# Patient Record
Sex: Female | Born: 1985 | Race: Black or African American | Hispanic: No | Marital: Single | Smoking: Former smoker
Health system: Southern US, Community
[De-identification: ages and names within clinical notes are randomized; demographics above are authoritative.]

## PROBLEM LIST (undated history)

## (undated) DIAGNOSIS — Z87440 Personal history of urinary (tract) infections: Secondary | ICD-10-CM

## (undated) DIAGNOSIS — Z8619 Personal history of other infectious and parasitic diseases: Secondary | ICD-10-CM

## (undated) DIAGNOSIS — M5416 Radiculopathy, lumbar region: Secondary | ICD-10-CM

## (undated) DIAGNOSIS — N2889 Other specified disorders of kidney and ureter: Secondary | ICD-10-CM

## (undated) DIAGNOSIS — A64 Unspecified sexually transmitted disease: Secondary | ICD-10-CM

## (undated) HISTORY — DX: Other specified disorders of kidney and ureter: N28.89

## (undated) HISTORY — DX: Personal history of urinary (tract) infections: Z87.440

## (undated) HISTORY — DX: Radiculopathy, lumbar region: M54.16

## (undated) HISTORY — DX: Unspecified sexually transmitted disease: A64

## (undated) HISTORY — DX: Personal history of other infectious and parasitic diseases: Z86.19

---

## 2009-10-08 ENCOUNTER — Emergency Department (HOSPITAL_COMMUNITY): Admission: EM | Admit: 2009-10-08 | Discharge: 2009-10-08 | Payer: Self-pay | Admitting: Emergency Medicine

## 2013-03-11 LAB — HM PAP SMEAR: HM Pap smear: NORMAL

## 2013-03-14 ENCOUNTER — Other Ambulatory Visit (HOSPITAL_COMMUNITY)
Admission: RE | Admit: 2013-03-14 | Discharge: 2013-03-14 | Disposition: A | Discharge status: Home / Self Care | Payer: 59 | Source: Ambulatory Visit | Attending: Obstetrics and Gynecology | Admitting: Obstetrics and Gynecology

## 2013-03-14 ENCOUNTER — Other Ambulatory Visit: Payer: Self-pay | Admitting: Obstetrics and Gynecology

## 2013-03-14 DIAGNOSIS — Z01419 Encounter for gynecological examination (general) (routine) without abnormal findings: Secondary | ICD-10-CM | POA: Insufficient documentation

## 2013-03-14 DIAGNOSIS — Z113 Encounter for screening for infections with a predominantly sexual mode of transmission: Secondary | ICD-10-CM | POA: Insufficient documentation

## 2014-01-17 ENCOUNTER — Other Ambulatory Visit (INDEPENDENT_AMBULATORY_CARE_PROVIDER_SITE_OTHER): Payer: 59

## 2014-01-17 ENCOUNTER — Encounter: Payer: Self-pay | Admitting: Internal Medicine

## 2014-01-17 ENCOUNTER — Ambulatory Visit (INDEPENDENT_AMBULATORY_CARE_PROVIDER_SITE_OTHER): Payer: 59 | Admitting: Internal Medicine

## 2014-01-17 VITALS — BP 112/76 | HR 92 | Temp 98.4°F | Ht 67.0 in | Wt 245.4 lb

## 2014-01-17 DIAGNOSIS — Z0000 Encounter for general adult medical examination without abnormal findings: Secondary | ICD-10-CM

## 2014-01-17 DIAGNOSIS — Z1159 Encounter for screening for other viral diseases: Secondary | ICD-10-CM

## 2014-01-17 DIAGNOSIS — Z23 Encounter for immunization: Secondary | ICD-10-CM

## 2014-01-17 DIAGNOSIS — E669 Obesity, unspecified: Secondary | ICD-10-CM | POA: Insufficient documentation

## 2014-01-17 LAB — CBC WITH DIFFERENTIAL/PLATELET
Basophils Absolute: 0 10*3/uL (ref 0.0–0.1)
Basophils Relative: 0.4 % (ref 0.0–3.0)
EOS ABS: 0.1 10*3/uL (ref 0.0–0.7)
Eosinophils Relative: 0.9 % (ref 0.0–5.0)
HEMATOCRIT: 41.8 % (ref 36.0–46.0)
Hemoglobin: 14.1 g/dL (ref 12.0–15.0)
LYMPHS ABS: 2.2 10*3/uL (ref 0.7–4.0)
Lymphocytes Relative: 34.8 % (ref 12.0–46.0)
MCHC: 33.8 g/dL (ref 30.0–36.0)
MCV: 89.4 fl (ref 78.0–100.0)
MONO ABS: 0.4 10*3/uL (ref 0.1–1.0)
Monocytes Relative: 5.7 % (ref 3.0–12.0)
Neutro Abs: 3.7 10*3/uL (ref 1.4–7.7)
Neutrophils Relative %: 58.2 % (ref 43.0–77.0)
PLATELETS: 236 10*3/uL (ref 150.0–400.0)
RBC: 4.68 Mil/uL (ref 3.87–5.11)
RDW: 13.6 % (ref 11.5–15.5)
WBC: 6.4 10*3/uL (ref 4.0–10.5)

## 2014-01-17 LAB — URINALYSIS, ROUTINE W REFLEX MICROSCOPIC
Bilirubin Urine: NEGATIVE
Hgb urine dipstick: NEGATIVE
Ketones, ur: NEGATIVE
Leukocytes, UA: NEGATIVE
Nitrite: POSITIVE — AB
PH: 6 (ref 5.0–8.0)
RBC / HPF: NONE SEEN (ref 0–?)
TOTAL PROTEIN, URINE-UPE24: NEGATIVE
URINE GLUCOSE: NEGATIVE
UROBILINOGEN UA: 0.2 (ref 0.0–1.0)

## 2014-01-17 LAB — LIPID PANEL
CHOLESTEROL: 175 mg/dL (ref 0–200)
HDL: 32.5 mg/dL — AB (ref >39.00)
LDL Cholesterol: 118 mg/dL — ABNORMAL HIGH (ref 0–99)
TRIGLYCERIDES: 125 mg/dL (ref 0.0–149.0)
Total CHOL/HDL Ratio: 5
VLDL: 25 mg/dL (ref 0.0–40.0)

## 2014-01-17 LAB — BASIC METABOLIC PANEL
BUN: 11 mg/dL (ref 6–23)
CO2: 27 meq/L (ref 19–32)
Calcium: 9.3 mg/dL (ref 8.4–10.5)
Chloride: 102 mEq/L (ref 96–112)
Creatinine, Ser: 0.7 mg/dL (ref 0.4–1.2)
GFR: 121.54 mL/min (ref >60.00)
Glucose, Bld: 75 mg/dL (ref 70–99)
POTASSIUM: 3.5 meq/L (ref 3.5–5.1)
Sodium: 136 mEq/L (ref 135–145)

## 2014-01-17 LAB — HEPATIC FUNCTION PANEL
ALT: 46 U/L — ABNORMAL HIGH (ref 0–35)
AST: 26 U/L (ref 0–37)
Albumin: 3.9 g/dL (ref 3.5–5.2)
Alkaline Phosphatase: 107 U/L (ref 39–117)
Bilirubin, Direct: 0.1 mg/dL (ref 0.0–0.3)
TOTAL PROTEIN: 7.8 g/dL (ref 6.0–8.3)
Total Bilirubin: 0.8 mg/dL (ref 0.2–1.2)

## 2014-01-17 LAB — TSH: TSH: 1.39 u[IU]/mL (ref 0.35–4.50)

## 2014-01-17 NOTE — Patient Instructions (Addendum)
It was good to see you today.  We have reviewed your prior records including labs and tests today  Health Maintenance reviewed - Gardisil #2 given today - return in 78mofor #3 (last in series) -all other recommended immunizations and age-appropriate screenings are up-to-date.  Test(s) ordered today. Your results will be released to [facility](or called to you) after review, usually within 72hours after test completion. If any changes need to be made, you will be notified at that same time.  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 Look into [email or web address] other type of food journal to assist you in this process.  Please schedule followup in 12 months for annual exam and labs, call sooner if problems.  Health Maintenance, Female A healthy lifestyle and preventative care can promote health and wellness.  Maintain regular health, dental, and eye exams.  Eat a healthy diet. Foods like vegetables, fruits, whole grains, low-fat dairy products, and lean protein foods contain the nutrients you need without too many calories. Decrease your intake of foods high in solid fats, added sugars, and salt. Get information about a proper diet from your caregiver, if necessary.  Regular physical exercise is one of the most important things you can do for your health. Most adults should get at least 150 minutes of moderate-intensity exercise (any activity that increases your heart rate and causes you to sweat) each week. In addition, most adults need muscle-strengthening exercises on 2 or more days a week.   Maintain a healthy weight. The body mass index (BMI) is a screening tool to identify possible weight problems. It provides an estimate of body fat  based on height and weight. Your caregiver can help determine your BMI, and can help you achieve or maintain a healthy weight. For adults 20 years and older:  A BMI below 18.5 is considered underweight.  A BMI of 18.5 to 24.9 is normal.  A BMI of 25 to 29.9 is considered overweight.  A BMI of 30 and above is considered obese.  Maintain normal blood lipids and cholesterol by exercising and minimizing your intake of saturated fat. Eat a balanced diet with plenty of fruits and vegetables. Blood tests for lipids and cholesterol should begin at age 2161and be repeated every 5 years. If your lipid or cholesterol levels are high, you are over 50, or you are a high risk for heart disease, you may need your cholesterol levels checked more frequently.Ongoing high lipid and cholesterol levels should be treated with medicines if diet and exercise are not effective.  If you smoke, find out from your caregiver how to quit. If you do not use tobacco, do not start.  Lung cancer screening is recommended for adults aged 524 80years who are at high risk for developing lung cancer because of a history of smoking. Yearly low-dose computed tomography (CT) is recommended for people who have at least a 30-pack-year history of smoking and are a current smoker or have quit within the past 15 years. A pack year of smoking is smoking an average of 1 pack of cigarettes a day for 1 year (for example: 1 pack a day for 30 years or 2 packs a day for 15 years). Yearly screening should continue until the smoker has stopped smoking  for at least 15 years. Yearly screening should also be stopped for people who develop a health problem that would prevent them from having lung cancer treatment.  If you are pregnant, do not drink alcohol. If you are breastfeeding, be very cautious about drinking alcohol. If you are not pregnant and choose to drink alcohol, do not exceed 1 drink per day. One drink is considered to be 12 ounces (355 mL) of  beer, 5 ounces (148 mL) of wine, or 1.5 ounces (44 mL) of liquor.  Avoid use of street drugs. Do not share needles with anyone. Ask for help if you need support or instructions about stopping the use of drugs.  High blood pressure causes heart disease and increases the risk of stroke. Blood pressure should be checked at least every 1 to 2 years. Ongoing high blood pressure should be treated with medicines, if weight loss and exercise are not effective.  If you are 62 to 27 years old, ask your caregiver if you should take aspirin to prevent strokes.  Diabetes screening involves taking a blood sample to check your fasting blood sugar level. This should be done once every 3 years, after age 71, if you are within normal weight and without risk factors for diabetes. Testing should be considered at a younger age or be carried out more frequently if you are overweight and have at least 1 risk factor for diabetes.  Breast cancer screening is essential preventative care for women. You should practice "breast self-awareness." This means understanding the normal appearance and feel of your breasts and may include breast self-examination. Any changes detected, no matter how small, should be reported to a caregiver. Women in their 32s and 30s should have a clinical breast exam (CBE) by a caregiver as part of a regular health exam every 1 to 3 years. After age 80, women should have a CBE every year. Starting at age 25, women should consider having a mammogram (breast X-ray) every year. Women who have a family history of breast cancer should talk to their caregiver about genetic screening. Women at a high risk of breast cancer should talk to their caregiver about having an MRI and a mammogram every year.  Breast cancer gene (BRCA)-related cancer risk assessment is recommended for women who have family members with BRCA-related cancers. BRCA-related cancers include breast, ovarian, tubal, and peritoneal cancers. Having  family members with these cancers may be associated with an increased risk for harmful changes (mutations) in the breast cancer genes BRCA1 and BRCA2. Results of the assessment will determine the need for genetic counseling and BRCA1 and BRCA2 testing.  The Pap test is a screening test for cervical cancer. Women should have a Pap test starting at age 66. Between ages 46 and 44, Pap tests should be repeated every 2 years. Beginning at age 37, you should have a Pap test every 3 years as long as the past 3 Pap tests have been normal. If you had a hysterectomy for a problem that was not cancer or a condition that could lead to cancer, then you no longer need Pap tests. If you are between ages 22 and 30, and you have had normal Pap tests going back 10 years, you no longer need Pap tests. If you have had past treatment for cervical cancer or a condition that could lead to cancer, you need Pap tests and screening for cancer for at least 20 years after your treatment. If Pap tests have been discontinued, risk factors (such  as a new sexual partner) need to be reassessed to determine if screening should be resumed. Some women have medical problems that increase the chance of getting cervical cancer. In these cases, your caregiver may recommend more frequent screening and Pap tests.  The human papillomavirus (HPV) test is an additional test that may be used for cervical cancer screening. The HPV test looks for the virus that can cause the cell changes on the cervix. The cells collected during the Pap test can be tested for HPV. The HPV test could be used to screen women aged 51 years and older, and should be used in women of any age who have unclear Pap test results. After the age of 15, women should have HPV testing at the same frequency as a Pap test.  Colorectal cancer can be detected and often prevented. Most routine colorectal cancer screening begins at the age of 57 and continues through age 58. However, your  caregiver may recommend screening at an earlier age if you have risk factors for colon cancer. On a yearly basis, your caregiver may provide home test kits to check for hidden blood in the stool. Use of a small camera at the end of a tube, to directly examine the colon (sigmoidoscopy or colonoscopy), can detect the earliest forms of colorectal cancer. Talk to your caregiver about this at age 42, when routine screening begins. Direct examination of the colon should be repeated every 5 to 10 years through age 23, unless early forms of pre-cancerous polyps or small growths are found.  Hepatitis C blood testing is recommended for all people born from [birth year] through 1964 and any individual with known risks for hepatitis C.  Practice safe sex. Use condoms and avoid high-risk sexual practices to reduce the spread of sexually transmitted infections (STIs). Sexually active women aged 81 and younger should be checked for Chlamydia, which is a common sexually transmitted infection. Older women with new or multiple partners should also be tested for Chlamydia. Testing for other STIs is recommended if you are sexually active and at increased risk.  Osteoporosis is a disease in which the bones lose minerals and strength with aging. This can result in serious bone fractures. The risk of osteoporosis can be identified using a bone density scan. Women ages 16 and over and women at risk for fractures or osteoporosis should discuss screening with their caregivers. Ask your caregiver whether you should be taking a calcium supplement or vitamin D to reduce the rate of osteoporosis.  Menopause can be associated with physical symptoms and risks. Hormone replacement therapy is available to decrease symptoms and risks. You should talk to your caregiver about whether hormone replacement therapy is right for you.  Use sunscreen. Apply sunscreen liberally and repeatedly throughout the day. You should seek shade when your shadow is  shorter than you. Protect yourself by wearing long sleeves, pants, a wide-brimmed hat, and sunglasses year round, whenever you are outdoors.  Notify your caregiver of new moles or changes in moles, especially if there is a change in shape or color. Also notify your caregiver if a mole is larger than the size of a pencil eraser.  Stay current with your immunizations. Document Released: 02/28/2011 Document Revised: 12/10/2012 Document Reviewed: 02/28/2011 [facility] Patient Information 2014 [place].

## 2014-01-17 NOTE — Progress Notes (Signed)
   Subjective:    Patient ID: Theresa Trevino, female    DOB: 1986-02-27, 27 y.o.   MRN: [id number]  HPI  New patient to me, here to establish with PCP Also patient is here today for annual physical. Patient feels well and has no complaints. Needs paperwork for nursing school completed Reviewed chronic medical issues and interval medical events  Past Medical History  Diagnosis Date  . Hx: UTI (urinary tract infection)   . History of chicken pox    No family history on file. History  Substance Use Topics  . Smoking status: Never Smoker   . Smokeless tobacco: Not on file  . Alcohol Use: Yes    Review of Systems  Constitutional: Negative for fatigue and unexpected weight change.  Respiratory: Negative for cough, shortness of breath and wheezing.   Cardiovascular: Negative for chest pain, palpitations and leg swelling.  Gastrointestinal: Negative for nausea, abdominal pain and diarrhea.  Neurological: Negative for dizziness, weakness, light-headedness and headaches.  Psychiatric/Behavioral: Negative for dysphoric mood. The patient is not nervous/anxious.   All other systems reviewed and are negative.      Objective:   Physical Exam  BP 112/76  Pulse 92  Temp(Src) 98.4 F (36.9 C) (Oral)  Ht $R'5\' 7"'jv$  (1.702 m)  Wt 245 lb 6.4 oz (111.313 kg)  BMI 38.43 kg/m2  SpO2 97% Wt Readings from Last 3 Encounters:  01/17/14 245 lb 6.4 oz (111.313 kg)   Constitutional: She is obese, but appears well-developed and well-nourished. No distress.  HENT: Head: Normocephalic and atraumatic. Ears: B TMs ok, no erythema or effusion; Nose: Nose normal. Mouth/Throat: Oropharynx is clear and moist. No oropharyngeal exudate.  Eyes: Conjunctivae and EOM are normal. Pupils are equal, round, and reactive to light. No scleral icterus.  Neck: Normal range of motion. Neck supple. No JVD present. No thyromegaly present.  Cardiovascular: Normal rate, regular rhythm and normal heart sounds.  No murmur heard.  No BLE edema. Pulmonary/Chest: Effort normal and breath sounds normal. No respiratory distress. She has no wheezes.  Abdominal: Soft. Bowel sounds are normal. She exhibits no distension. There is no tenderness. no masses Musculoskeletal: Normal range of motion, no joint effusions. No gross deformities Neurological: She is alert and oriented to person, place, and time. No cranial nerve deficit. Coordination, balance, strength, speech and gait are normal.  Skin: Skin is warm and dry. No rash noted. No erythema.  Psychiatric: She has a normal mood and affect. Her behavior is normal. Judgment and thought content normal.    No results found for this basename: WBC, HGB, HCT, PLT, GLUCOSE, CHOL, TRIG, HDL, LDLDIRECT, LDLCALC, ALT, AST, NA, K, CL, CREATININE, BUN, CO2, TSH, PSA, INR, GLUF, HGBA1C, MICROALBUR    No results found.     Assessment & Plan:   CPX/v70.0 - Patient has been counseled on age-appropriate routine health concerns for screening and prevention. These are reviewed and up-to-date. Immunizations are up-to-date or declined. Labs ordered and reviewed. Form for [employer] completed today pending MMR titers (ordered today)

## 2014-01-18 LAB — RUBEOLA ANTIBODY IGG: Rubeola IgG: 82.6 AU/mL — ABNORMAL HIGH (ref <25.00)

## 2014-01-18 LAB — MUMPS ANTIBODY, IGG: Mumps IgG: 59.2 AU/mL — ABNORMAL HIGH (ref <9.00)

## 2014-01-18 NOTE — Assessment & Plan Note (Signed)
Wt Readings from Last 3 Encounters:  01/17/14 245 lb 6.4 oz (111.313 kg)   The patient is asked to make an attempt to improve diet and exercise patterns to aid in medical management of this problem.

## 2014-01-21 ENCOUNTER — Telehealth: Payer: Self-pay | Admitting: *Deleted

## 2014-01-21 ENCOUNTER — Encounter: Payer: Self-pay | Admitting: Internal Medicine

## 2014-01-21 MED ORDER — CIPROFLOXACIN HCL 500 MG PO TABS: 500.0000 mg | ORAL_TABLET | Freq: Two times a day (BID) | ORAL | Status: DC

## 2014-01-21 NOTE — Telephone Encounter (Signed)
MD didn't send cipro(see lab report) sending to [facility].../lmb

## 2014-03-13 ENCOUNTER — Encounter: Payer: Self-pay | Admitting: Internal Medicine

## 2014-03-13 ENCOUNTER — Ambulatory Visit (INDEPENDENT_AMBULATORY_CARE_PROVIDER_SITE_OTHER): Payer: 59 | Admitting: Internal Medicine

## 2014-03-13 ENCOUNTER — Other Ambulatory Visit (INDEPENDENT_AMBULATORY_CARE_PROVIDER_SITE_OTHER): Payer: 59

## 2014-03-13 VITALS — BP 118/72 | HR 95 | Temp 98.5°F | Ht 67.0 in | Wt 249.0 lb

## 2014-03-13 DIAGNOSIS — R300 Dysuria: Secondary | ICD-10-CM

## 2014-03-13 DIAGNOSIS — Z202 Contact with and (suspected) exposure to infections with a predominantly sexual mode of transmission: Secondary | ICD-10-CM

## 2014-03-13 DIAGNOSIS — Z1159 Encounter for screening for other viral diseases: Secondary | ICD-10-CM

## 2014-03-13 LAB — URINALYSIS, ROUTINE W REFLEX MICROSCOPIC
BILIRUBIN URINE: NEGATIVE
Hgb urine dipstick: NEGATIVE
KETONES UR: NEGATIVE
Leukocytes, UA: NEGATIVE
Nitrite: NEGATIVE
PH: 7 (ref 5.0–8.0)
RBC / HPF: NONE SEEN (ref 0–?)
TOTAL PROTEIN, URINE-UPE24: NEGATIVE
URINE GLUCOSE: NEGATIVE
Urobilinogen, UA: 0.2 (ref 0.0–1.0)
WBC, UA: NONE SEEN (ref 0–?)

## 2014-03-13 NOTE — Progress Notes (Signed)
Pre visit review using our clinic review tool, if applicable. No additional management support is needed unless otherwise documented below in the visit note. 

## 2014-03-13 NOTE — Progress Notes (Signed)
   Subjective:    Patient ID: Theresa Trevino, female    DOB: 1986-04-20, 28 y.o.   MRN: [id number]  HPI  Patient is here for follow up - requests labs for STI screen Follows with gyn for PAP Also reviewed chronic medical issues and interval medical events  Past Medical History  Diagnosis Date  . Hx: UTI (urinary tract infection)   . History of chicken pox     Review of Systems  Constitutional: Negative for fatigue and unexpected weight change.  Genitourinary: Positive for dysuria and frequency. Negative for vaginal discharge and genital sores.       Objective:   Physical Exam  BP 118/72  Pulse 95  Temp(Src) 98.5 F (36.9 C) (Oral)  Ht 5\' 7"  (1.702 m)  Wt 249 lb (112.946 kg)  BMI 38.99 kg/m2  SpO2 99% Wt Readings from Last 3 Encounters:  03/13/14 249 lb (112.946 kg)  01/17/14 245 lb 6.4 oz (111.313 kg)   Constitutional: She is MO, appears well-developed and well-nourished. No distress.  Neck: Normal range of motion. Neck supple. No JVD present. No thyromegaly present.  Cardiovascular: Normal rate, regular rhythm and normal heart sounds.  No murmur heard. No BLE edema. Pulmonary/Chest: Effort normal and breath sounds normal. No respiratory distress. She has no wheezes. GU: defer to gyn  Psychiatric: She has a normal mood and affect. Her behavior is normal. Judgment and thought content normal.   Lab Results  Component Value Date   WBC 6.4 01/17/2014   HGB 14.1 01/17/2014   HCT 41.8 01/17/2014   PLT 236.0 01/17/2014   GLUCOSE 75 01/17/2014   CHOL 175 01/17/2014   TRIG 125.0 01/17/2014   HDL 32.50* 01/17/2014   LDLCALC 118* 01/17/2014   ALT 46* 01/17/2014   AST 26 01/17/2014   NA 136 01/17/2014   K 3.5 01/17/2014   CL 102 01/17/2014   CREATININE 0.7 01/17/2014   BUN 11 01/17/2014   CO2 27 01/17/2014   TSH 1.39 01/17/2014    No results found.     Assessment & Plan:   STI exposure - check labs - education on safe sex provided/reviewed  Dysuria - check UA given recurrent  UTI

## 2014-03-13 NOTE — Patient Instructions (Signed)
It was good to see you today.  We have reviewed your prior records including labs and tests today  Test(s) ordered today. Your results will be released to MyChart (or called to you) after review, usually within 72hours after test completion. If any changes need to be made, you will be notified at that same time.  Medications reviewed and updated, no changes recommended at this time.  Please schedule followup in 12 months for annual, call sooner if problems.

## 2014-03-14 LAB — RPR

## 2014-03-14 LAB — HIV ANTIBODY (ROUTINE TESTING W REFLEX): HIV: NONREACTIVE

## 2014-03-14 LAB — RUBELLA SCREEN: Rubella: 3.18 Index — ABNORMAL HIGH (ref <0.90)

## 2014-03-15 LAB — GC/CHLAMYDIA PROBE AMP, URINE
Chlamydia, Swab/Urine, PCR: NEGATIVE
GC Probe Amp, Urine: NEGATIVE

## 2014-07-18 ENCOUNTER — Ambulatory Visit (INDEPENDENT_AMBULATORY_CARE_PROVIDER_SITE_OTHER): Payer: 59 | Admitting: *Deleted

## 2014-07-18 DIAGNOSIS — Z23 Encounter for immunization: Secondary | ICD-10-CM

## 2014-11-26 ENCOUNTER — Encounter: Payer: Self-pay | Admitting: Internal Medicine

## 2014-11-26 ENCOUNTER — Other Ambulatory Visit (INDEPENDENT_AMBULATORY_CARE_PROVIDER_SITE_OTHER): Payer: 59

## 2014-11-26 ENCOUNTER — Other Ambulatory Visit: Payer: Self-pay | Admitting: Internal Medicine

## 2014-11-26 ENCOUNTER — Ambulatory Visit (INDEPENDENT_AMBULATORY_CARE_PROVIDER_SITE_OTHER): Payer: 59 | Admitting: Internal Medicine

## 2014-11-26 VITALS — BP 112/70 | HR 84 | Temp 97.5°F | Ht 67.0 in | Wt 249.0 lb

## 2014-11-26 DIAGNOSIS — R1030 Lower abdominal pain, unspecified: Secondary | ICD-10-CM

## 2014-11-26 DIAGNOSIS — R1013 Epigastric pain: Secondary | ICD-10-CM

## 2014-11-26 LAB — URINALYSIS, ROUTINE W REFLEX MICROSCOPIC
Bilirubin Urine: NEGATIVE
Hgb urine dipstick: NEGATIVE
Ketones, ur: NEGATIVE
Leukocytes, UA: NEGATIVE
Nitrite: POSITIVE — AB
PH: 6 (ref 5.0–8.0)
Specific Gravity, Urine: 1.025 (ref 1.000–1.030)
Total Protein, Urine: NEGATIVE
UROBILINOGEN UA: 0.2 (ref 0.0–1.0)
Urine Glucose: NEGATIVE

## 2014-11-26 LAB — CBC WITH DIFFERENTIAL/PLATELET
BASOS ABS: 0 10*3/uL (ref 0.0–0.1)
Basophils Relative: 0.5 % (ref 0.0–3.0)
EOS ABS: 0.1 10*3/uL (ref 0.0–0.7)
EOS PCT: 1 % (ref 0.0–5.0)
HEMATOCRIT: 41.3 % (ref 36.0–46.0)
Hemoglobin: 14.2 g/dL (ref 12.0–15.0)
Lymphocytes Relative: 37.2 % (ref 12.0–46.0)
Lymphs Abs: 2.5 10*3/uL (ref 0.7–4.0)
MCHC: 34.4 g/dL (ref 30.0–36.0)
MCV: 88.5 fl (ref 78.0–100.0)
Monocytes Absolute: 0.4 10*3/uL (ref 0.1–1.0)
Monocytes Relative: 6.5 % (ref 3.0–12.0)
NEUTROS ABS: 3.7 10*3/uL (ref 1.4–7.7)
Neutrophils Relative %: 54.8 % (ref 43.0–77.0)
Platelets: 233 10*3/uL (ref 150.0–400.0)
RBC: 4.67 Mil/uL (ref 3.87–5.11)
RDW: 13.9 % (ref 11.5–15.5)
WBC: 6.8 10*3/uL (ref 4.0–10.5)

## 2014-11-26 LAB — HEPATIC FUNCTION PANEL
ALK PHOS: 111 U/L (ref 39–117)
ALT: 39 U/L — AB (ref 0–35)
AST: 17 U/L (ref 0–37)
Albumin: 3.8 g/dL (ref 3.5–5.2)
Bilirubin, Direct: 0.1 mg/dL (ref 0.0–0.3)
Total Bilirubin: 0.3 mg/dL (ref 0.2–1.2)
Total Protein: 7.3 g/dL (ref 6.0–8.3)

## 2014-11-26 LAB — BASIC METABOLIC PANEL
BUN: 12 mg/dL (ref 6–23)
CO2: 26 mEq/L (ref 19–32)
Calcium: 9.2 mg/dL (ref 8.4–10.5)
Chloride: 105 mEq/L (ref 96–112)
Creatinine, Ser: 0.64 mg/dL (ref 0.40–1.20)
GFR: 142.79 mL/min (ref >60.00)
Glucose, Bld: 85 mg/dL (ref 70–99)
Potassium: 4 mEq/L (ref 3.5–5.1)
Sodium: 136 mEq/L (ref 135–145)

## 2014-11-26 MED ORDER — FAMOTIDINE 40 MG PO TABS: 40.0000 mg | ORAL_TABLET | Freq: Every day | ORAL | Status: DC

## 2014-11-26 MED ORDER — PROMETHAZINE HCL 12.5 MG PO TABS: 12.5000 mg | ORAL_TABLET | Freq: Three times a day (TID) | ORAL | Status: DC | PRN

## 2014-11-26 NOTE — Patient Instructions (Addendum)
It was good to see you today.  We have reviewed your prior records including labs and tests today  Test(s) ordered today. Your results will be released to MyChart (or called to you) after review, usually within 72hours after test completion. If any changes need to be made, you will be notified at that same time.  Medications reviewed and updated Take prescription Pepcid 40 mg once daily for next 30 days, then as needed for stomach discomfort Also use promethazine as needed/if needed for nausea Your prescription(s) have been submitted to your pharmacy. Please take as directed and contact our office if you believe you are having problem(s) with the medication(s).  Depending on lab results and your response to treatment, we will determine if other testing as needed. Please let us know if symptoms unimproved in the next 2 weeks, sooner if worse  Abdominal Pain, Women Abdominal (stomach, pelvic, or belly) pain can be caused by many things. It is important to tell your doctor:  The location of the pain.  Does it come and go or is it present all the time?  Are there things that start the pain (eating certain foods, exercise)?  Are there other symptoms associated with the pain (fever, nausea, vomiting, diarrhea)? All of this is helpful to know when trying to find the cause of the pain. CAUSES   Stomach: virus or bacteria infection, or ulcer.  Intestine: appendicitis (inflamed appendix), regional ileitis (Crohn's disease), ulcerative colitis (inflamed colon), irritable bowel syndrome, diverticulitis (inflamed diverticulum of the colon), or cancer of the stomach or intestine.  Gallbladder disease or stones in the gallbladder.  Kidney disease, kidney stones, or infection.  Pancreas infection or cancer.  Fibromyalgia (pain disorder).  Diseases of the female organs:  Uterus: fibroid (non-cancerous) tumors or infection.  Fallopian tubes: infection or tubal pregnancy.  Ovary: cysts or  tumors.  Pelvic adhesions (scar tissue).  Endometriosis (uterus lining tissue growing in the pelvis and on the pelvic organs).  Pelvic congestion syndrome (female organs filling up with blood just before the menstrual period).  Pain with the menstrual period.  Pain with ovulation (producing an egg).  Pain with an IUD (intrauterine device, birth control) in the uterus.  Cancer of the female organs.  Functional pain (pain not caused by a disease, may improve without treatment).  Psychological pain.  Depression. DIAGNOSIS  Your doctor will decide the seriousness of your pain by doing an examination.  Blood tests.  X-rays.  Ultrasound.  CT scan (computed tomography, special type of X-ray).  MRI (magnetic resonance imaging).  Cultures, for infection.  Barium enema (dye inserted in the large intestine, to better view it with X-rays).  Colonoscopy (looking in intestine with a lighted tube).  Laparoscopy (minor surgery, looking in abdomen with a lighted tube).  Major abdominal exploratory surgery (looking in abdomen with a large incision). TREATMENT  The treatment will depend on the cause of the pain.   Many cases can be observed and treated at home.  Over-the-counter medicines recommended by your caregiver.  Prescription medicine.  Antibiotics, for infection.  Birth control pills, for painful periods or for ovulation pain.  Hormone treatment, for endometriosis.  Nerve blocking injections.  Physical therapy.  Antidepressants.  Counseling with a psychologist or psychiatrist.  Minor or major surgery. HOME CARE INSTRUCTIONS   Do not take laxatives, unless directed by your caregiver.  Take over-the-counter pain medicine only if ordered by your caregiver. Do not take aspirin because it can cause an upset stomach or bleeding.  Try a clear liquid diet (broth or water) as ordered by your caregiver. Slowly move to a bland diet, as tolerated, if the pain is  related to the stomach or intestine.  Have a thermometer and take your temperature several times a day, and record it.  Bed rest and sleep, if it helps the pain.  Avoid sexual intercourse, if it causes pain.  Avoid stressful situations.  Keep your follow-up appointments and tests, as your caregiver orders.  If the pain does not go away with medicine or surgery, you may try:  Acupuncture.  Relaxation exercises (yoga, meditation).  Group therapy.  Counseling. SEEK MEDICAL CARE IF:   You notice certain foods cause stomach pain.  Your home care treatment is not helping your pain.  You need stronger pain medicine.  You want your IUD removed.  You feel faint or lightheaded.  You develop nausea and vomiting.  You develop a rash.  You are having side effects or an allergy to your medicine. SEEK IMMEDIATE MEDICAL CARE IF:   Your pain does not go away or gets worse.  You have a fever.  Your pain is felt only in portions of the abdomen. The right side could possibly be appendicitis. The left lower portion of the abdomen could be colitis or diverticulitis.  You are passing blood in your stools (bright red or black tarry stools, with or without vomiting).  You have blood in your urine.  You develop chills, with or without a fever.  You pass out. MAKE SURE YOU:   Understand these instructions.  Will watch your condition.  Will get help right away if you are not doing well or get worse. Document Released: 06/12/2007 Document Revised: 12/30/2013 Document Reviewed: 07/02/2009 [facility] Patient Information 2015 [place], [place]. This information is not intended to replace advice given to you by your health care provider. Make sure you discuss any questions you have with your health care provider.

## 2014-11-26 NOTE — Progress Notes (Signed)
Pre visit review using our clinic review tool, if applicable. No additional management support is needed unless otherwise documented below in the visit note. 

## 2014-11-26 NOTE — Progress Notes (Signed)
   Subjective:    Patient ID: Theresa Trevino, female    DOB: [birth year], 29 y.o.   MRN: [id number]  HPI  Patient here for abd pain and irregular bowels Onset of symptoms 2 weeks ago Pain located in epigastric region, radiating to left side Pain worse before meals and with laying down associated with symptoms of fullness and occasional nausea. Also constipation which has improved with probiotic use in the past 2 weeks No history of same Denies NSAID use, excess alcohol, history of ulcers or   Past Medical History  Diagnosis Date  . Hx: UTI (urinary tract infection)   . History of chicken pox     Review of Systems  Constitutional: Negative for fever and unexpected weight change.  Cardiovascular: Negative for chest pain and leg swelling.  Gastrointestinal: Positive for nausea (occ), abdominal pain (epigastric stab radiating L to back), constipation (improving with probiotic x 2 weeks) and blood in stool (streak and mucus x 1 BM 2 weeks ago). Negative for vomiting and diarrhea.  Allergic/Immunologic: Negative for environmental allergies and food allergies.       Objective:    Physical Exam  Constitutional: She appears well-developed and well-nourished. No distress.  obese  Cardiovascular: Normal rate, regular rhythm and normal heart sounds.   No murmur heard. Pulmonary/Chest: Effort normal and breath sounds normal. No respiratory distress.  Abdominal: Soft. Bowel sounds are normal. She exhibits no distension. There is no tenderness. There is no rebound.  Musculoskeletal: She exhibits no edema.    BP 112/70 mmHg  Pulse 84  Temp(Src) 97.5 F (36.4 C) (Oral)  Ht 5\' 7"  (1.702 m)  Wt 249 lb (112.946 kg)  BMI 38.99 kg/m2  SpO2 97%  LMP 11/21/2014 Wt Readings from Last 3 Encounters:  11/26/14 249 lb (112.946 kg)  03/13/14 249 lb (112.946 kg)  01/17/14 245 lb 6.4 oz (111.313 kg)     Lab Results  Component Value Date   WBC 6.4 01/17/2014   HGB 14.1 01/17/2014   HCT  41.8 01/17/2014   PLT 236.0 01/17/2014   GLUCOSE 75 01/17/2014   CHOL 175 01/17/2014   TRIG 125.0 01/17/2014   HDL 32.50* 01/17/2014   LDLCALC 118* 01/17/2014   ALT 46* 01/17/2014   AST 26 01/17/2014   NA 136 01/17/2014   K 3.5 01/17/2014   CL 102 01/17/2014   CREATININE 0.7 01/17/2014   BUN 11 01/17/2014   CO2 27 01/17/2014   TSH 1.39 01/17/2014    No results found.     Assessment & Plan:   Nonspecific abdominal pain, epigastric region Suspect dyspepsia  Check screening labs to exclude other medical illness including urine pregnancy Treat with H2 blocker prescription dose 30 days, then as needed Promethazine as needed for nausea  Other testing or treatment to depend on results and response to treatment, patient agrees to call in the next 2 weeks of unimproved, sooner if worse  Problem List Items Addressed This Visit    None    Visit Diagnoses    Abdominal pain, epigastric    -  Primary    Relevant Orders    Basic metabolic panel    Hepatic function panel    CBC with Differential/Platelet    Urinalysis, Routine w reflex microscopic    POCT urine pregnancy    Dyspepsia            Rene PaciValerie Kenyan Karnes, MD

## 2014-11-27 LAB — PREGNANCY, URINE: PREG TEST UR: NEGATIVE

## 2015-01-22 ENCOUNTER — Other Ambulatory Visit: Payer: Self-pay | Admitting: Internal Medicine

## 2015-01-23 ENCOUNTER — Encounter: Payer: 59 | Admitting: Internal Medicine

## 2015-01-27 ENCOUNTER — Ambulatory Visit (INDEPENDENT_AMBULATORY_CARE_PROVIDER_SITE_OTHER): Payer: 59 | Admitting: Internal Medicine

## 2015-01-27 ENCOUNTER — Other Ambulatory Visit (INDEPENDENT_AMBULATORY_CARE_PROVIDER_SITE_OTHER): Payer: 59

## 2015-01-27 ENCOUNTER — Other Ambulatory Visit: Payer: Self-pay | Admitting: Internal Medicine

## 2015-01-27 ENCOUNTER — Encounter: Payer: Self-pay | Admitting: Internal Medicine

## 2015-01-27 VITALS — BP 104/68 | HR 87 | Temp 98.3°F | Resp 16 | Ht 67.0 in | Wt 250.0 lb

## 2015-01-27 DIAGNOSIS — R945 Abnormal results of liver function studies: Principal | ICD-10-CM

## 2015-01-27 DIAGNOSIS — Z0000 Encounter for general adult medical examination without abnormal findings: Secondary | ICD-10-CM | POA: Diagnosis not present

## 2015-01-27 DIAGNOSIS — R7989 Other specified abnormal findings of blood chemistry: Secondary | ICD-10-CM | POA: Insufficient documentation

## 2015-01-27 LAB — COMPREHENSIVE METABOLIC PANEL
ALBUMIN: 4.2 g/dL (ref 3.5–5.2)
ALT: 72 U/L — ABNORMAL HIGH (ref 0–35)
AST: 46 U/L — AB (ref 0–37)
Alkaline Phosphatase: 119 U/L — ABNORMAL HIGH (ref 39–117)
BUN: 11 mg/dL (ref 6–23)
CHLORIDE: 106 meq/L (ref 96–112)
CO2: 25 mEq/L (ref 19–32)
Calcium: 9.2 mg/dL (ref 8.4–10.5)
Creatinine, Ser: 0.69 mg/dL (ref 0.40–1.20)
GFR: 130.75 mL/min (ref >60.00)
Glucose, Bld: 91 mg/dL (ref 70–99)
Potassium: 4.4 mEq/L (ref 3.5–5.1)
Sodium: 137 mEq/L (ref 135–145)
TOTAL PROTEIN: 7.9 g/dL (ref 6.0–8.3)
Total Bilirubin: 0.5 mg/dL (ref 0.2–1.2)

## 2015-01-27 LAB — CBC WITH DIFFERENTIAL/PLATELET
BASOS PCT: 0.4 % (ref 0.0–3.0)
Basophils Absolute: 0 10*3/uL (ref 0.0–0.1)
EOS ABS: 0.1 10*3/uL (ref 0.0–0.7)
EOS PCT: 1 % (ref 0.0–5.0)
HCT: 42.3 % (ref 36.0–46.0)
Hemoglobin: 14.5 g/dL (ref 12.0–15.0)
LYMPHS PCT: 35.7 % (ref 12.0–46.0)
Lymphs Abs: 2.3 10*3/uL (ref 0.7–4.0)
MCHC: 34.3 g/dL (ref 30.0–36.0)
MCV: 89.1 fl (ref 78.0–100.0)
MONO ABS: 0.4 10*3/uL (ref 0.1–1.0)
Monocytes Relative: 5.8 % (ref 3.0–12.0)
NEUTROS PCT: 57.1 % (ref 43.0–77.0)
Neutro Abs: 3.7 10*3/uL (ref 1.4–7.7)
Platelets: 231 10*3/uL (ref 150.0–400.0)
RBC: 4.75 Mil/uL (ref 3.87–5.11)
RDW: 13.6 % (ref 11.5–15.5)
WBC: 6.5 10*3/uL (ref 4.0–10.5)

## 2015-01-27 LAB — HCG, QUANTITATIVE, PREGNANCY: Quantitative HCG: 0.18 m[IU]/mL

## 2015-01-27 LAB — LIPID PANEL
CHOL/HDL RATIO: 5
Cholesterol: 188 mg/dL (ref 0–200)
HDL: 40.6 mg/dL (ref >39.00)
LDL CALC: 132 mg/dL — AB (ref 0–99)
NONHDL: 147.4
Triglycerides: 79 mg/dL (ref 0.0–149.0)
VLDL: 15.8 mg/dL (ref 0.0–40.0)

## 2015-01-27 LAB — TSH: TSH: 1.53 u[IU]/mL (ref 0.35–4.50)

## 2015-01-27 NOTE — Progress Notes (Signed)
Pre visit review using our clinic review tool, if applicable. No additional management support is needed unless otherwise documented below in the visit note. 

## 2015-01-27 NOTE — Progress Notes (Signed)
Subjective:  Patient ID: Theresa Trevino, female    DOB: 03-22-1986  Age: 29 y.o. MRN: [id number]  CC: Annual Exam   HPI Theresa Trevino presents for a CPX - she tells that her heartburn has been well controlled with pepcid and she does not take phenergan anymore.  Outpatient Prescriptions Prior to Visit  Medication Sig Dispense Refill  . famotidine (PEPCID) 40 MG tablet Take 1 tablet (40 mg total) by mouth daily. 90 tablet 1  . promethazine (PHENERGAN) 12.5 MG tablet Take 1 tablet (12.5 mg total) by mouth every 8 (eight) hours as needed for nausea or vomiting. 20 tablet 0   No facility-administered medications prior to visit.    ROS Review of Systems  Constitutional: Negative.  Negative for fever, chills, diaphoresis, appetite change and fatigue.  HENT: Negative.  Negative for facial swelling, trouble swallowing and voice change.   Eyes: Negative.   Respiratory: Negative.  Negative for cough, choking, chest tightness, shortness of breath and stridor.   Cardiovascular: Negative.  Negative for chest pain, palpitations and leg swelling.  Gastrointestinal: Negative.  Negative for nausea, vomiting, abdominal pain, diarrhea and constipation.  Endocrine: Negative.   Genitourinary: Negative.   Musculoskeletal: Negative.  Negative for myalgias, back pain, joint swelling and arthralgias.  Skin: Negative.  Negative for rash.  Allergic/Immunologic: Negative.   Neurological: Negative.  Negative for dizziness, syncope, speech difficulty, light-headedness, numbness and headaches.  Hematological: Negative.  Negative for adenopathy.  Psychiatric/Behavioral: Negative.     Objective:  BP 104/68 mmHg  Pulse 87  Temp(Src) 98.3 F (36.8 C) (Oral)  Resp 16  Ht  (1.702 m)  Wt 250 lb (113.399 kg)  BMI 39.15 kg/m2  SpO2 97%  LMP 11/27/2014 (Approximate)  BP Readings from Last 3 Encounters:  01/27/15 104/68  11/26/14 112/70  03/13/14 118/72    Wt Readings from Last 3 Encounters:    01/27/15 250 lb (113.399 kg)  11/26/14 249 lb (112.946 kg)  03/13/14 249 lb (112.946 kg)    Physical Exam  Constitutional: She is oriented to person, place, and time. She appears well-developed and well-nourished. No distress.  HENT:  Head: Normocephalic and atraumatic.  Mouth/Throat: Oropharynx is clear and moist. No oropharyngeal exudate.  Eyes: Conjunctivae are normal. Right eye exhibits no discharge. Left eye exhibits no discharge. No scleral icterus.  Neck: Normal range of motion. Neck supple. No JVD present. No tracheal deviation present. No thyromegaly present.  Cardiovascular: Normal rate, regular rhythm, normal heart sounds and intact distal pulses.  Exam reveals no gallop and no friction rub.   No murmur heard. Pulmonary/Chest: Effort normal and breath sounds normal. No stridor. No respiratory distress. She has no wheezes. She has no rales. She exhibits no tenderness.  Abdominal: Soft. Bowel sounds are normal. She exhibits no distension and no mass. There is no tenderness. There is no rebound and no guarding.  Musculoskeletal: Normal range of motion. She exhibits no edema or tenderness.  Lymphadenopathy:    She has no cervical adenopathy.  Neurological: She is oriented to person, place, and time.  Skin: Skin is warm and dry. No rash noted. She is not diaphoretic. No erythema. No pallor.  Psychiatric: She has a normal mood and affect. Her behavior is normal. Judgment and thought content normal.  Vitals reviewed.   Lab Results  Component Value Date   WBC 6.8 11/26/2014   HGB 14.2 11/26/2014   HCT 41.3 11/26/2014   PLT 233.0 11/26/2014   GLUCOSE 85 11/26/2014  CHOL 175 01/17/2014   TRIG 125.0 01/17/2014   HDL 32.50* 01/17/2014   LDLCALC 118* 01/17/2014   ALT 39* 11/26/2014   AST 17 11/26/2014   NA 136 11/26/2014   K 4.0 11/26/2014   CL 105 11/26/2014   CREATININE 0.64 11/26/2014   BUN 12 11/26/2014   CO2 26 11/26/2014   TSH 1.39 01/17/2014    No results  found.  Assessment & Plan:   Theresa Trevino was seen today for annual exam.  Diagnoses and all orders for this visit:  Routine general medical examination at a health care facility - exam done, will review labs, her cycles are inconsistent and irregular - will check a beta-hcg and other labs to screen for secondary causes, she tells me that her PAP is UTD and she was not willing to have it done again today. Orders: -     Lipid panel; Future -     Comprehensive metabolic panel; Future -     CBC with Differential/Platelet; Future -     TSH; Future -     hCG, quantitative, pregnancy; Future  I have discontinued Ms. Martinson's promethazine. I am also having her maintain her famotidine.  No orders of the defined types were placed in this encounter.   See AVS for instructions about healthy living and anticipatory guidance.  Follow-up: Return in about 4 weeks (around 02/24/2015).  Sanda Lingerhomas Breeley Bischof, MD

## 2015-01-27 NOTE — Patient Instructions (Signed)
Preventive Care for Adults A healthy lifestyle and preventive care can promote health and wellness. Preventive health guidelines for women include the following key practices.  A routine yearly physical is a good way to check with your health care provider about your health and preventive screening. It is a chance to share any concerns and updates on your health and to receive a thorough exam.  Visit your dentist for a routine exam and preventive care every 6 months. Brush your teeth twice a day and floss once a day. Good oral hygiene prevents tooth decay and gum disease.  The frequency of eye exams is based on your age, health, family medical history, use of contact lenses, and other factors. Follow your health care provider's recommendations for frequency of eye exams.  Eat a healthy diet. Foods like vegetables, fruits, whole grains, low-fat dairy products, and lean protein foods contain the nutrients you need without too many calories. Decrease your intake of foods high in solid fats, added sugars, and salt. Eat the right amount of calories for you.Get information about a proper diet from your health care provider, if necessary.  Regular physical exercise is one of the most important things you can do for your health. Most adults should get at least 150 minutes of moderate-intensity exercise (any activity that increases your heart rate and causes you to sweat) each week. In addition, most adults need muscle-strengthening exercises on 2 or more days a week.  Maintain a healthy weight. The body mass index (BMI) is a screening tool to identify possible weight problems. It provides an estimate of body fat based on height and weight. Your health care provider can find your BMI and can help you achieve or maintain a healthy weight.For adults 20 years and older:  A BMI below 18.5 is considered underweight.  A BMI of 18.5 to 24.9 is normal.  A BMI of 25 to 29.9 is considered overweight.  A BMI of  30 and above is considered obese.  Maintain normal blood lipids and cholesterol levels by exercising and minimizing your intake of saturated fat. Eat a balanced diet with plenty of fruit and vegetables. Blood tests for lipids and cholesterol should begin at age 76 and be repeated every 5 years. If your lipid or cholesterol levels are high, you are over 50, or you are at high risk for heart disease, you may need your cholesterol levels checked more frequently.Ongoing high lipid and cholesterol levels should be treated with medicines if diet and exercise are not working.  If you smoke, find out from your health care provider how to quit. If you do not use tobacco, do not start.  Lung cancer screening is recommended for adults aged 22-80 years who are at high risk for developing lung cancer because of a history of smoking. A yearly low-dose CT scan of the lungs is recommended for people who have at least a 30-pack-year history of smoking and are a current smoker or have quit within the past 15 years. A pack year of smoking is smoking an average of 1 pack of cigarettes a day for 1 year (for example: 1 pack a day for 30 years or 2 packs a day for 15 years). Yearly screening should continue until the smoker has stopped smoking for at least 15 years. Yearly screening should be stopped for people who develop a health problem that would prevent them from having lung cancer treatment.  If you are pregnant, do not drink alcohol. If you are breastfeeding,  be very cautious about drinking alcohol. If you are not pregnant and choose to drink alcohol, do not have more than 1 drink per day. One drink is considered to be 12 ounces (355 mL) of beer, 5 ounces (148 mL) of wine, or 1.5 ounces (44 mL) of liquor.  Avoid use of street drugs. Do not share needles with anyone. Ask for help if you need support or instructions about stopping the use of drugs.  High blood pressure causes heart disease and increases the risk of  stroke. Your blood pressure should be checked at least every 1 to 2 years. Ongoing high blood pressure should be treated with medicines if weight loss and exercise do not work.  If you are 75-52 years old, ask your health care provider if you should take aspirin to prevent strokes.  Diabetes screening involves taking a blood sample to check your fasting blood sugar level. This should be done once every 3 years, after age 15, if you are within normal weight and without risk factors for diabetes. Testing should be considered at a younger age or be carried out more frequently if you are overweight and have at least 1 risk factor for diabetes.  Breast cancer screening is essential preventive care for women. You should practice "breast self-awareness." This means understanding the normal appearance and feel of your breasts and may include breast self-examination. Any changes detected, no matter how small, should be reported to a health care provider. Women in their 58s and 30s should have a clinical breast exam (CBE) by a health care provider as part of a regular health exam every 1 to 3 years. After age 16, women should have a CBE every year. Starting at age 53, women should consider having a mammogram (breast X-ray test) every year. Women who have a family history of breast cancer should talk to their health care provider about genetic screening. Women at a high risk of breast cancer should talk to their health care providers about having an MRI and a mammogram every year.  Breast cancer gene (BRCA)-related cancer risk assessment is recommended for women who have family members with BRCA-related cancers. BRCA-related cancers include breast, ovarian, tubal, and peritoneal cancers. Having family members with these cancers may be associated with an increased risk for harmful changes (mutations) in the breast cancer genes BRCA1 and BRCA2. Results of the assessment will determine the need for genetic counseling and  BRCA1 and BRCA2 testing.  Routine pelvic exams to screen for cancer are no longer recommended for nonpregnant women who are considered low risk for cancer of the pelvic organs (ovaries, uterus, and vagina) and who do not have symptoms. Ask your health care provider if a screening pelvic exam is right for you.  If you have had past treatment for cervical cancer or a condition that could lead to cancer, you need Pap tests and screening for cancer for at least 20 years after your treatment. If Pap tests have been discontinued, your risk factors (such as having a new sexual partner) need to be reassessed to determine if screening should be resumed. Some women have medical problems that increase the chance of getting cervical cancer. In these cases, your health care provider may recommend more frequent screening and Pap tests.  The HPV test is an additional test that may be used for cervical cancer screening. The HPV test looks for the virus that can cause the cell changes on the cervix. The cells collected during the Pap test can be  tested for HPV. The HPV test could be used to screen women aged 30 years and older, and should be used in women of any age who have unclear Pap test results. After the age of 30, women should have HPV testing at the same frequency as a Pap test.  Colorectal cancer can be detected and often prevented. Most routine colorectal cancer screening begins at the age of 50 years and continues through age 75 years. However, your health care provider may recommend screening at an earlier age if you have risk factors for colon cancer. On a yearly basis, your health care provider may provide home test kits to check for hidden blood in the stool. Use of a small camera at the end of a tube, to directly examine the colon (sigmoidoscopy or colonoscopy), can detect the earliest forms of colorectal cancer. Talk to your health care provider about this at age 50, when routine screening begins. Direct  exam of the colon should be repeated every 5-10 years through age 75 years, unless early forms of pre-cancerous polyps or small growths are found.  People who are at an increased risk for hepatitis B should be screened for this virus. You are considered at high risk for hepatitis B if:  You were born in a country where hepatitis B occurs often. Talk with your health care provider about which countries are considered high risk.  Your parents were born in a high-risk country and you have not received a shot to protect against hepatitis B (hepatitis B vaccine).  You have HIV or AIDS.  You use needles to inject street drugs.  You live with, or have sex with, someone who has hepatitis B.  You get hemodialysis treatment.  You take certain medicines for conditions like cancer, organ transplantation, and autoimmune conditions.  Hepatitis C blood testing is recommended for all people born from 1945 through 1965 and any individual with known risks for hepatitis C.  Practice safe sex. Use condoms and avoid high-risk sexual practices to reduce the spread of sexually transmitted infections (STIs). STIs include gonorrhea, chlamydia, syphilis, trichomonas, herpes, HPV, and human immunodeficiency virus (HIV). Herpes, HIV, and HPV are viral illnesses that have no cure. They can result in disability, cancer, and death.  You should be screened for sexually transmitted illnesses (STIs) including gonorrhea and chlamydia if:  You are sexually active and are younger than 24 years.  You are older than 24 years and your health care provider tells you that you are at risk for this type of infection.  Your sexual activity has changed since you were last screened and you are at an increased risk for chlamydia or gonorrhea. Ask your health care provider if you are at risk.  If you are at risk of being infected with HIV, it is recommended that you take a prescription medicine daily to prevent HIV infection. This is  called preexposure prophylaxis (PrEP). You are considered at risk if:  You are a heterosexual woman, are sexually active, and are at increased risk for HIV infection.  You take drugs by injection.  You are sexually active with a partner who has HIV.  Talk with your health care provider about whether you are at high risk of being infected with HIV. If you choose to begin PrEP, you should first be tested for HIV. You should then be tested every 3 months for as long as you are taking PrEP.  Osteoporosis is a disease in which the bones lose minerals and strength   with aging. This can result in serious bone fractures or breaks. The risk of osteoporosis can be identified using a bone density scan. Women ages 65 years and over and women at risk for fractures or osteoporosis should discuss screening with their health care providers. Ask your health care provider whether you should take a calcium supplement or vitamin D to reduce the rate of osteoporosis.  Menopause can be associated with physical symptoms and risks. Hormone replacement therapy is available to decrease symptoms and risks. You should talk to your health care provider about whether hormone replacement therapy is right for you.  Use sunscreen. Apply sunscreen liberally and repeatedly throughout the day. You should seek shade when your shadow is shorter than you. Protect yourself by wearing long sleeves, pants, a wide-brimmed hat, and sunglasses year round, whenever you are outdoors.  Once a month, do a whole body skin exam, using a mirror to look at the skin on your back. Tell your health care provider of new moles, moles that have irregular borders, moles that are larger than a pencil eraser, or moles that have changed in shape or color.  Stay current with required vaccines (immunizations).  Influenza vaccine. All adults should be immunized every year.  Tetanus, diphtheria, and acellular pertussis (Td, Tdap) vaccine. Pregnant women should  receive 1 dose of Tdap vaccine during each pregnancy. The dose should be obtained regardless of the length of time since the last dose. Immunization is preferred during the 27th-36th week of gestation. An adult who has not previously received Tdap or who does not know her vaccine status should receive 1 dose of Tdap. This initial dose should be followed by tetanus and diphtheria toxoids (Td) booster doses every 10 years. Adults with an unknown or incomplete history of completing a 3-dose immunization series with Td-containing vaccines should begin or complete a primary immunization series including a Tdap dose. Adults should receive a Td booster every 10 years.  Varicella vaccine. An adult without evidence of immunity to varicella should receive 2 doses or a second dose if she has previously received 1 dose. Pregnant females who do not have evidence of immunity should receive the first dose after pregnancy. This first dose should be obtained before leaving the health care facility. The second dose should be obtained 4-8 weeks after the first dose.  Human papillomavirus (HPV) vaccine. Females aged 13-26 years who have not received the vaccine previously should obtain the 3-dose series. The vaccine is not recommended for use in pregnant females. However, pregnancy testing is not needed before receiving a dose. If a female is found to be pregnant after receiving a dose, no treatment is needed. In that case, the remaining doses should be delayed until after the pregnancy. Immunization is recommended for any person with an immunocompromised condition through the age of 26 years if she did not get any or all doses earlier. During the 3-dose series, the second dose should be obtained 4-8 weeks after the first dose. The third dose should be obtained 24 weeks after the first dose and 16 weeks after the second dose.  Zoster vaccine. One dose is recommended for adults aged 60 years or older unless certain conditions are  present.  Measles, mumps, and rubella (MMR) vaccine. Adults born before 1957 generally are considered immune to measles and mumps. Adults born in 1957 or later should have 1 or more doses of MMR vaccine unless there is a contraindication to the vaccine or there is laboratory evidence of immunity to   each of the three diseases. A routine second dose of MMR vaccine should be obtained at least 28 days after the first dose for students attending postsecondary schools, health care workers, or international travelers. People who received inactivated measles vaccine or an unknown type of measles vaccine during 1963-1967 should receive 2 doses of MMR vaccine. People who received inactivated mumps vaccine or an unknown type of mumps vaccine before 1979 and are at high risk for mumps infection should consider immunization with 2 doses of MMR vaccine. For females of childbearing age, rubella immunity should be determined. If there is no evidence of immunity, females who are not pregnant should be vaccinated. If there is no evidence of immunity, females who are pregnant should delay immunization until after pregnancy. Unvaccinated health care workers born before 1957 who lack laboratory evidence of measles, mumps, or rubella immunity or laboratory confirmation of disease should consider measles and mumps immunization with 2 doses of MMR vaccine or rubella immunization with 1 dose of MMR vaccine.  Pneumococcal 13-valent conjugate (PCV13) vaccine. When indicated, a person who is uncertain of her immunization history and has no record of immunization should receive the PCV13 vaccine. An adult aged 19 years or older who has certain medical conditions and has not been previously immunized should receive 1 dose of PCV13 vaccine. This PCV13 should be followed with a dose of pneumococcal polysaccharide (PPSV23) vaccine. The PPSV23 vaccine dose should be obtained at least 8 weeks after the dose of PCV13 vaccine. An adult aged 19  years or older who has certain medical conditions and previously received 1 or more doses of PPSV23 vaccine should receive 1 dose of PCV13. The PCV13 vaccine dose should be obtained 1 or more years after the last PPSV23 vaccine dose.  Pneumococcal polysaccharide (PPSV23) vaccine. When PCV13 is also indicated, PCV13 should be obtained first. All adults aged 65 years and older should be immunized. An adult younger than age 65 years who has certain medical conditions should be immunized. Any person who resides in a nursing home or long-term care facility should be immunized. An adult smoker should be immunized. People with an immunocompromised condition and certain other conditions should receive both PCV13 and PPSV23 vaccines. People with human immunodeficiency virus (HIV) infection should be immunized as soon as possible after diagnosis. Immunization during chemotherapy or radiation therapy should be avoided. Routine use of PPSV23 vaccine is not recommended for American Indians, [place] Natives, or people younger than 65 years unless there are medical conditions that require PPSV23 vaccine. When indicated, people who have unknown immunization and have no record of immunization should receive PPSV23 vaccine. One-time revaccination 5 years after the first dose of PPSV23 is recommended for people aged 19-64 years who have chronic kidney failure, nephrotic syndrome, asplenia, or immunocompromised conditions. People who received 1-2 doses of PPSV23 before age 65 years should receive another dose of PPSV23 vaccine at age 65 years or later if at least 5 years have passed since the previous dose. Doses of PPSV23 are not needed for people immunized with PPSV23 at or after age 65 years.  Meningococcal vaccine. Adults with asplenia or persistent complement component deficiencies should receive 2 doses of quadrivalent meningococcal conjugate (MenACWY-D) vaccine. The doses should be obtained at least 2 months apart.  Microbiologists working with certain meningococcal bacteria, military recruits, people at risk during an outbreak, and people who travel to or live in countries with a high rate of meningitis should be immunized. A first-year college student up through age   21 years who is living in a residence [place] should receive a dose if she did not receive a dose on or after her 16th birthday. Adults who have certain high-risk conditions should receive one or more doses of vaccine.  Hepatitis A vaccine. Adults who wish to be protected from this disease, have certain high-risk conditions, work with hepatitis A-infected animals, work in hepatitis A research labs, or travel to or work in countries with a high rate of hepatitis A should be immunized. Adults who were previously unvaccinated and who anticipate close contact with an international adoptee during the first 60 days after arrival in the [place] from a country with a high rate of hepatitis A should be immunized.  Hepatitis B vaccine. Adults who wish to be protected from this disease, have certain high-risk conditions, may be exposed to blood or other infectious body fluids, are household contacts or sex partners of hepatitis B positive people, are clients or workers in certain care facilities, or travel to or work in countries with a high rate of hepatitis B should be immunized.  Haemophilus influenzae type b (Hib) vaccine. A previously unvaccinated person with asplenia or sickle cell disease or having a scheduled splenectomy should receive 1 dose of Hib vaccine. Regardless of previous immunization, a recipient of a hematopoietic stem cell transplant should receive a 3-dose series 6-12 months after her successful transplant. Hib vaccine is not recommended for adults with HIV infection. Preventive Services / Frequency Ages 64 to 68 years  Blood pressure check.** / Every 1 to 2 years.  Lipid and cholesterol check.** / Every 5 years beginning at age  22.  Clinical breast exam.** / Every 3 years for women in their 88s and 53s.  BRCA-related cancer risk assessment.** / For women who have family members with a BRCA-related cancer (breast, ovarian, tubal, or peritoneal cancers).  Pap test.** / Every 2 years from ages 90 through 51. Every 3 years starting at age 21 through age 56 or 3 with a history of 3 consecutive normal Pap tests.  HPV screening.** / Every 3 years from ages 24 through ages 1 to 46 with a history of 3 consecutive normal Pap tests.  Hepatitis C blood test.** / For any individual with known risks for hepatitis C.  Skin self-exam. / Monthly.  Influenza vaccine. / Every year.  Tetanus, diphtheria, and acellular pertussis (Tdap, Td) vaccine.** / Consult your health care provider. Pregnant women should receive 1 dose of Tdap vaccine during each pregnancy. 1 dose of Td every 10 years.  Varicella vaccine.** / Consult your health care provider. Pregnant females who do not have evidence of immunity should receive the first dose after pregnancy.  HPV vaccine. / 3 doses over 6 months, if 72 and younger. The vaccine is not recommended for use in pregnant females. However, pregnancy testing is not needed before receiving a dose.  Measles, mumps, rubella (MMR) vaccine.** / You need at least 1 dose of MMR if you were born in 1957 or later. You may also need a 2nd dose. For females of childbearing age, rubella immunity should be determined. If there is no evidence of immunity, females who are not pregnant should be vaccinated. If there is no evidence of immunity, females who are pregnant should delay immunization until after pregnancy.  Pneumococcal 13-valent conjugate (PCV13) vaccine.** / Consult your health care provider.  Pneumococcal polysaccharide (PPSV23) vaccine.** / 1 to 2 doses if you smoke cigarettes or if you have certain conditions.  Meningococcal vaccine.** /  1 dose if you are age 19 to 21 years and a first-year college  student living in a residence [place], or have one of several medical conditions, you need to get vaccinated against meningococcal disease. You may also need additional booster doses.  Hepatitis A vaccine.** / Consult your health care provider.  Hepatitis B vaccine.** / Consult your health care provider.  Haemophilus influenzae type b (Hib) vaccine.** / Consult your health care provider. Ages 40 to 64 years  Blood pressure check.** / Every 1 to 2 years.  Lipid and cholesterol check.** / Every 5 years beginning at age 20 years.  Lung cancer screening. / Every year if you are aged 55-80 years and have a 30-pack-year history of smoking and currently smoke or have quit within the past 15 years. Yearly screening is stopped once you have quit smoking for at least 15 years or develop a health problem that would prevent you from having lung cancer treatment.  Clinical breast exam.** / Every year after age 40 years.  BRCA-related cancer risk assessment.** / For women who have family members with a BRCA-related cancer (breast, ovarian, tubal, or peritoneal cancers).  Mammogram.** / Every year beginning at age 40 years and continuing for as long as you are in good health. Consult with your health care provider.  Pap test.** / Every 3 years starting at age 30 years through age 65 or 70 years with a history of 3 consecutive normal Pap tests.  HPV screening.** / Every 3 years from ages 30 years through ages 65 to 70 years with a history of 3 consecutive normal Pap tests.  Fecal occult blood test (FOBT) of stool. / Every year beginning at age 50 years and continuing until age 75 years. You may not need to do this test if you get a colonoscopy every 10 years.  Flexible sigmoidoscopy or colonoscopy.** / Every 5 years for a flexible sigmoidoscopy or every 10 years for a colonoscopy beginning at age 50 years and continuing until age 75 years.  Hepatitis C blood test.** / For all people born from 1945 through  1965 and any individual with known risks for hepatitis C.  Skin self-exam. / Monthly.  Influenza vaccine. / Every year.  Tetanus, diphtheria, and acellular pertussis (Tdap/Td) vaccine.** / Consult your health care provider. Pregnant women should receive 1 dose of Tdap vaccine during each pregnancy. 1 dose of Td every 10 years.  Varicella vaccine.** / Consult your health care provider. Pregnant females who do not have evidence of immunity should receive the first dose after pregnancy.  Zoster vaccine.** / 1 dose for adults aged 60 years or older.  Measles, mumps, rubella (MMR) vaccine.** / You need at least 1 dose of MMR if you were born in 1957 or later. You may also need a 2nd dose. For females of childbearing age, rubella immunity should be determined. If there is no evidence of immunity, females who are not pregnant should be vaccinated. If there is no evidence of immunity, females who are pregnant should delay immunization until after pregnancy.  Pneumococcal 13-valent conjugate (PCV13) vaccine.** / Consult your health care provider.  Pneumococcal polysaccharide (PPSV23) vaccine.** / 1 to 2 doses if you smoke cigarettes or if you have certain conditions.  Meningococcal vaccine.** / Consult your health care provider.  Hepatitis A vaccine.** / Consult your health care provider.  Hepatitis B vaccine.** / Consult your health care provider.  Haemophilus influenzae type b (Hib) vaccine.** / Consult your health care provider. Ages 65   years and over  Blood pressure check.** / Every 1 to 2 years.  Lipid and cholesterol check.** / Every 5 years beginning at age 22 years.  Lung cancer screening. / Every year if you are aged 73-80 years and have a 30-pack-year history of smoking and currently smoke or have quit within the past 15 years. Yearly screening is stopped once you have quit smoking for at least 15 years or develop a health problem that would prevent you from having lung cancer  treatment.  Clinical breast exam.** / Every year after age 4 years.  BRCA-related cancer risk assessment.** / For women who have family members with a BRCA-related cancer (breast, ovarian, tubal, or peritoneal cancers).  Mammogram.** / Every year beginning at age 40 years and continuing for as long as you are in good health. Consult with your health care provider.  Pap test.** / Every 3 years starting at age 9 years through age 34 or 91 years with 3 consecutive normal Pap tests. Testing can be stopped between 65 and 70 years with 3 consecutive normal Pap tests and no abnormal Pap or HPV tests in the past 10 years.  HPV screening.** / Every 3 years from ages 57 years through ages 64 or 45 years with a history of 3 consecutive normal Pap tests. Testing can be stopped between 65 and 70 years with 3 consecutive normal Pap tests and no abnormal Pap or HPV tests in the past 10 years.  Fecal occult blood test (FOBT) of stool. / Every year beginning at age 15 years and continuing until age 17 years. You may not need to do this test if you get a colonoscopy every 10 years.  Flexible sigmoidoscopy or colonoscopy.** / Every 5 years for a flexible sigmoidoscopy or every 10 years for a colonoscopy beginning at age 86 years and continuing until age 71 years.  Hepatitis C blood test.** / For all people born from [birth year] through 1964 and any individual with known risks for hepatitis C.  Osteoporosis screening.** / A one-time screening for women ages 83 years and over and women at risk for fractures or osteoporosis.  Skin self-exam. / Monthly.  Influenza vaccine. / Every year.  Tetanus, diphtheria, and acellular pertussis (Tdap/Td) vaccine.** / 1 dose of Td every 10 years.  Varicella vaccine.** / Consult your health care provider.  Zoster vaccine.** / 1 dose for adults aged 61 years or older.  Pneumococcal 13-valent conjugate (PCV13) vaccine.** / Consult your health care provider.  Pneumococcal  polysaccharide (PPSV23) vaccine.** / 1 dose for all adults aged 28 years and older.  Meningococcal vaccine.** / Consult your health care provider.  Hepatitis A vaccine.** / Consult your health care provider.  Hepatitis B vaccine.** / Consult your health care provider.  Haemophilus influenzae type b (Hib) vaccine.** / Consult your health care provider. ** Family history and personal history of risk and conditions may change your health care provider's recommendations. Document Released: 10/11/2001 Document Revised: 12/30/2013 Document Reviewed: 01/10/2011 [facility] Patient Information 2015 [place], [place]. This information is not intended to replace advice given to you by your health care provider. Make sure you discuss any questions you have with your health care provider.

## 2015-02-12 ENCOUNTER — Encounter: Payer: Self-pay | Admitting: Emergency Medicine

## 2015-02-12 ENCOUNTER — Emergency Department
Admission: EM | Admit: 2015-02-12 | Discharge: 2015-02-12 | Disposition: A | Discharge status: Home / Self Care | Payer: 59 | Source: Home / Self Care | Attending: Emergency Medicine | Admitting: Emergency Medicine

## 2015-02-12 DIAGNOSIS — J029 Acute pharyngitis, unspecified: Secondary | ICD-10-CM

## 2015-02-12 DIAGNOSIS — J069 Acute upper respiratory infection, unspecified: Secondary | ICD-10-CM | POA: Diagnosis not present

## 2015-02-12 DIAGNOSIS — B349 Viral infection, unspecified: Secondary | ICD-10-CM

## 2015-02-12 LAB — POCT INFLUENZA A/B
Influenza A, POC: NEGATIVE
Influenza B, POC: NEGATIVE

## 2015-02-12 LAB — POCT RAPID STREP A (OFFICE): Rapid Strep A Screen: NEGATIVE

## 2015-02-12 NOTE — ED Notes (Signed)
Pt c/o sore throat, body aches, dry cough and diarrhea since yesterday. Denies fever...she is taking mucinex.

## 2015-02-12 NOTE — ED Provider Notes (Signed)
CSN: [id number]     Arrival date & time 02/12/15  1226 History   First MD Initiated Contact with Patient 02/12/15 1254     Chief Complaint  Patient presents with  . Sore Throat   29 year old female, she works for [employer], phlebotomy, on weekends. HPI FLU  HPI : Viral URI symptoms for 1 day. Low-grade fever. Mild chills and sweats last night. Positive mild myalgias , fatigue, and nonspecific headache.  Had 4 loose watery stools with minimal mucus over past 24 hours, associated with generalized abdominal cramping, but the abdominal pain and cramping and diarrhea has resolved. No hot or swollen joints.  No history of recent tick bite.  Has mild sore throat, mild nonproductive cough, and mild coryza.   Review of Systems: Positive for fatigue, mild nasal congestion, mild sore throat, mild swollen anterior neck glands, mild nonproductive cough. Negative for acute vision changes, stiff neck, focal weakness, syncope, seizures, respiratory distress, vomiting,  GU symptoms, new rash.  Remainder of Review of Systems negative for acute change except as noted in the HPI.  Past Medical History  Diagnosis Date  . Hx: UTI (urinary tract infection)   . History of chicken pox    History reviewed. No pertinent past surgical history. History reviewed. No pertinent family history. History  Substance Use Topics  . Smoking status: Never Smoker   . Smokeless tobacco: Not on file  . Alcohol Use: Yes   OB History    No data available     Review of Systems  Allergies  Sulfa antibiotics  Home Medications   Prior to Admission medications   Medication Sig Start Date End Date Taking? Authorizing Provider  famotidine (PEPCID) 40 MG tablet Take 1 tablet (40 mg total) by mouth daily. 01/22/15   Newt Lukes, MD   BP 109/72 mmHg  Pulse 94  Temp(Src) 98.2 F (36.8 C) (Oral)  Wt 248 lb (112.492 kg)  SpO2 99%  LMP 11/27/2014 (Approximate) Physical Exam  Constitutional: She appears  well-developed and well-nourished.  Non-toxic appearance. She does not appear ill. No distress.  HENT:  Head: Normocephalic and atraumatic.  Right Ear: Tympanic membrane and external ear normal.  Left Ear: Tympanic membrane and external ear normal.  Nose: Rhinorrhea present.  Mouth/Throat: Mucous membranes are normal. Posterior oropharyngeal erythema (mild redness ) present. No oropharyngeal exudate.  Eyes: Conjunctivae are normal. Right eye exhibits no discharge. Left eye exhibits no discharge. No scleral icterus.  Neck: Neck supple.  Cardiovascular: Normal rate, regular rhythm and normal heart sounds.   Pulmonary/Chest: Breath sounds normal. No stridor. No respiratory distress. She has no wheezes. She has no rales.  Abdominal: Soft. There is no tenderness.  Musculoskeletal: She exhibits no edema.  Lymphadenopathy:    She has cervical adenopathy (mild shoddy anterior cervical nodes).  Neurological: She is alert.  Skin: Skin is warm and intact. No rash noted. She is not diaphoretic.  Psychiatric: She has a normal mood and affect.  Nursing note and vitals reviewed.   ED Course  Procedures (including critical care time) Labs Review Labs Reviewed  STREP A DNA PROBE  POCT RAPID STREP A (OFFICE)  POCT INFLUENZA A/B   Results for orders placed or performed during the hospital encounter of 02/12/15  POCT rapid strep A  Result Value Ref Range   Rapid Strep A Screen Negative Negative  POCT Influenza A/B  Result Value Ref Range   Influenza A, POC Negative Negative   Influenza B, POC Negative Negative  MDM   1. Viral syndrome   2. Upper respiratory tract infection   3. Acute pharyngitis, unspecified pharyngitis type    Explained that this is likely a viral syndrome that should resolve within the next 24-48 hours. Symptomatic care discussed. Strep DNA probe sent off. If she were to develop bacterial URI symptoms, we could call an antibiotic but we both agree to hold off on  antibiotics at this time. Follow-up with your primary care doctor in 5-7 days if not improving, or sooner if symptoms become worse. Precautions discussed. Red flags discussed. Questions invited and answered. Patient voiced understanding and agreement.     Lajean Manes, MD 02/12/15 1340

## 2015-02-13 ENCOUNTER — Other Ambulatory Visit: Payer: 59

## 2015-02-13 LAB — STREP A DNA PROBE: GASP: NEGATIVE

## 2015-02-17 ENCOUNTER — Encounter: Payer: Self-pay | Admitting: Internal Medicine

## 2015-02-17 ENCOUNTER — Ambulatory Visit
Admission: RE | Admit: 2015-02-17 | Discharge: 2015-02-17 | Disposition: A | Discharge status: Home / Self Care | Payer: 59 | Source: Ambulatory Visit | Attending: Internal Medicine | Admitting: Internal Medicine

## 2015-02-17 DIAGNOSIS — R945 Abnormal results of liver function studies: Principal | ICD-10-CM

## 2015-02-17 DIAGNOSIS — R7989 Other specified abnormal findings of blood chemistry: Secondary | ICD-10-CM

## 2015-05-18 ENCOUNTER — Encounter (HOSPITAL_COMMUNITY): Payer: Self-pay

## 2015-05-18 ENCOUNTER — Emergency Department (INDEPENDENT_AMBULATORY_CARE_PROVIDER_SITE_OTHER): Payer: 59

## 2015-05-18 ENCOUNTER — Emergency Department (INDEPENDENT_AMBULATORY_CARE_PROVIDER_SITE_OTHER)
Admission: EM | Admit: 2015-05-18 | Discharge: 2015-05-18 | Disposition: A | Discharge status: Home / Self Care | Payer: 59 | Source: Home / Self Care | Attending: Family Medicine | Admitting: Family Medicine

## 2015-05-18 DIAGNOSIS — S9032XA Contusion of left foot, initial encounter: Secondary | ICD-10-CM

## 2015-05-18 DIAGNOSIS — M722 Plantar fascial fibromatosis: Secondary | ICD-10-CM

## 2015-05-18 DIAGNOSIS — S93699A Other sprain of unspecified foot, initial encounter: Secondary | ICD-10-CM

## 2015-05-18 LAB — POCT PREGNANCY, URINE: PREG TEST UR: NEGATIVE

## 2015-05-18 MED ORDER — DICLOFENAC POTASSIUM 50 MG PO TABS: 50.0000 mg | ORAL_TABLET | Freq: Three times a day (TID) | ORAL | Status: DC

## 2015-05-18 NOTE — ED Notes (Signed)
ACE wrap applied to foot. Advised to consider FU w podiatry if has continued problems w feet

## 2015-05-18 NOTE — ED Provider Notes (Signed)
CSN: [id number]     Arrival date & time 05/18/15  1610 History   First MD Initiated Contact with Patient 05/18/15 1716     Chief Complaint  Patient presents with  . Foot Pain   (Consider location/radiation/quality/duration/timing/severity/associated sxs/prior Treatment) HPI Comments: 28 year old female was running in the rain earlier today around noon when she almost they will and tried to arrest her fall with the left foot. She apparently came down hard on the foot and is experiencing pain in the plantar aspect of the heel and along the medial border of the proximal foot. Denies ankle pain. The greatest pain is with weightbearing.   Past Medical History  Diagnosis Date  . Hx: UTI (urinary tract infection)   . History of chicken pox    History reviewed. No pertinent past surgical history. No family history on file. Social History  Substance Use Topics  . Smoking status: Never Smoker   . Smokeless tobacco: None  . Alcohol Use: Yes   OB History    No data available     Review of Systems  Constitutional: Negative for fever and activity change.  Respiratory: Negative.   Cardiovascular: Negative for chest pain.  Musculoskeletal: Negative for myalgias and back pain.       As per HPI  Skin: Negative for color change.  Neurological: Negative.     Allergies  Sulfa antibiotics  Home Medications   Prior to Admission medications   Medication Sig Start Date End Date Taking? Authorizing Provider  diclofenac (CATAFLAM) 50 MG tablet Take 1 tablet (50 mg total) by mouth 3 (three) times daily. One tablet TID with food prn pain. 05/18/15   Hayden Rasmussen, NP  famotidine (PEPCID) 40 MG tablet Take 1 tablet (40 mg total) by mouth daily. 01/22/15   Newt Lukes, MD   Meds Ordered and Administered this Visit  Medications - No data to display  BP 136/98 mmHg  Pulse 103  Temp(Src) 98.2 F (36.8 C) (Oral)  Resp 16  SpO2 98% No data found.   Physical Exam  Constitutional: She is  oriented to person, place, and time. She appears well-developed and well-nourished. No distress.  HENT:  Head: Normocephalic and atraumatic.  Eyes: EOM are normal.  Neck: Normal range of motion. Neck supple.  Cardiovascular: Normal rate.   Pulmonary/Chest: Effort normal. No respiratory distress.  Musculoskeletal:  Note swelling to the ankle or foot. There is tenderness to the medial edge of the left foot near the heel as well as plantar heel tenderness. No deformity or discoloration. Patient has a history of plantar fasciitis. Distal neurovascular and motor sensory is intact. Pedal pulse 2+.  Neurological: She is alert and oriented to person, place, and time. She exhibits normal muscle tone. Coordination normal.  Skin: Skin is warm and dry.  Psychiatric: She has a normal mood and affect.  Nursing note and vitals reviewed.   ED Course  Procedures (including critical care time)  Labs Review Labs Reviewed  POCT PREGNANCY, URINE    Imaging Review Dg Foot Complete Left  05/18/2015   CLINICAL DATA:  Left heel pain following injury of uncertain nature  EXAM: LEFT FOOT - COMPLETE 3+ VIEW  COMPARISON:  None.  FINDINGS: Small calcaneal plantar spur is noted. No acute fracture or dislocation is seen. No gross soft tissue abnormality is noted.  IMPRESSION: No acute abnormality noted.   Electronically Signed   By: Alcide Clever M.D.   On: 05/18/2015 18:25     Visual Acuity  Review  Right Eye Distance:   Left Eye Distance:   Bilateral Distance:    Right Eye Near:   Left Eye Near:    Bilateral Near:         MDM   1. Foot contusion, left, initial encounter   2. Traumatic plantar fasciitis    RICE Ice to sore areas. Limit weight bearing. Elevation. Cataflam for inflammation and pain.     Hayden Rasmussen, NP 05/18/15 [phone]

## 2015-05-18 NOTE — ED Notes (Signed)
Reportedly had stepped into a hole earlier today, now has painful ambulation; unsure LMP, sexually active w/o [facility]

## 2015-05-18 NOTE — Discharge Instructions (Signed)
Foot Contusion Ice to sore areas. Limit weight bearing. Elevation. Cataflam for inflammation and pain.   A foot contusion is a deep bruise to the foot. Contusions are the result of an injury that caused bleeding under the skin. The contusion may turn blue, purple, or yellow. Minor injuries will give you a painless contusion, but more severe contusions may stay painful and swollen for a few weeks. CAUSES  A foot contusion comes from a direct blow to that area, such as a heavy object falling on the foot. SYMPTOMS   Swelling of the foot.  Discoloration of the foot.  Tenderness or soreness of the foot. DIAGNOSIS  You will have a physical exam and will be asked about your history. You may need an X-ray of your foot to look for a broken bone (fracture).  TREATMENT  An elastic wrap may be recommended to support your foot. Resting, elevating, and applying cold compresses to your foot are often the best treatments for a foot contusion. Over-the-counter medicines may also be recommended for pain control. HOME CARE INSTRUCTIONS   Put ice on the injured area.  Put ice in a plastic bag.  Place a towel between your skin and the bag.  Leave the ice on for 15-20 minutes, 03-04 times a day.  Only take over-the-counter or prescription medicines for pain, discomfort, or fever as directed by your caregiver.  If told, use an elastic wrap as directed. This can help reduce swelling. You may remove the wrap for sleeping, showering, and bathing. If your toes become numb, cold, or blue, take the wrap off and reapply it more loosely.  Elevate your foot with pillows to reduce swelling.  Try to avoid standing or walking while the foot is painful. Do not resume use until instructed by your caregiver. Then, begin use gradually. If pain develops, decrease use. Gradually increase activities that do not cause discomfort until you have normal  use of your foot.  See your caregiver as directed. It is very important to keep all follow-up appointments in order to avoid any lasting problems with your foot, including long-term (chronic) pain. SEEK IMMEDIATE MEDICAL CARE IF:   You have increased redness, swelling, or pain in your foot.  Your swelling or pain is not relieved with medicines.  You have loss of feeling in your foot or are unable to move your toes.  Your foot turns cold or blue.  You have pain when you move your toes.  Your foot becomes warm to the touch.  Your contusion does not improve in 2 days. MAKE SURE YOU:   Understand these instructions.  Will watch your condition.  Will get help right away if you are not doing well or get worse. Document Released: 06/06/2006 Document Revised: 02/14/2012 Document Reviewed: 07/19/2011 [facility] Patient Information 2015 [place], [place]. This information is not intended to replace advice given to you by your health care provider. Make sure you discuss any questions you have with your health care provider.  Heel Spur A heel spur is a hook of bone that can form on the calcaneus (the heel bone and the largest bone of the foot). Heel spurs are often associated with plantar fasciitis and usually come in people who have had the problem for an extended period of time. The cause of the relationship is unknown. The pain associated with them is thought to be caused by an inflammation (soreness and redness) of the plantar fascia rather than the spur itself. The plantar fascia is a thick  fibrous like tissue that runs from the calcaneus (heel bone) to the ball of the foot. This strong, tight tissue helps maintain the arch of your foot. It helps distribute the weight across your foot as you walk or run. Stresses placed on the plantar fascia can be tremendous. When it is inflamed normal activities become painful. Pain is worse in the morning after sleeping. After sleeping the plantar fascia is  tight. The first movements stretch the fascia and this causes pain. As the tendon loosens, the pain usually gets better. It often returns with too much standing or walking.  About 70% of patients with plantar fasciitis have a heel spur. About half of people without foot pain also have heel spurs. DIAGNOSIS  The diagnosis of a heel spur is made by X-ray. The X-ray shows a hook of bone protruding from the bottom of the calcaneus at the point where the plantar fascia is attached to the heel bone.  TREATMENT  It is necessary to find out what is causing the stretching of the plantar fascia. If the cause is over-pronation (flat feet), orthotics and proper foot ware may help.  Stretching exercises, losing weight, wearing shoes that have a cushioned heel that absorbs shock, and elevating the heel with the use of a heel cradle, heel cup, or orthotics may all help. Heel cradles and heel cups provide extra comfort and cushion to the heel, and reduce the amount of shock to the sore area. AVOIDING THE PAIN OF PLANTAR FASCIITIS AND HEEL SPURS  Consult a sports medicine professional before beginning a new exercise program.  Walking programs offer a good workout. There is a lower chance of overuse injuries common to the runners. There is less impact and less jarring of the joints.  Begin all new exercise programs slowly. If problems or pains develop, decrease the amount of time or distance until you are at a comfortable level.  Wear good shoes and replace them regularly.  Stretch your foot and the heel cords at the back of the ankle (Achilles tendons) both before and after exercise.  Run or exercise on even surfaces that are not hard. For example, asphalt is better than pavement.  Do not run barefoot on hard surfaces.  If using a treadmill, vary the incline.  Do not continue to workout if you have foot or joint problems. Seek professional help if they do not improve. HOME CARE INSTRUCTIONS   Avoid  activities that cause you pain until you recover.  Use ice or cold packs to the problem or painful areas after working out.  Only take over-the-counter or prescription medicines for pain, discomfort, or fever as directed by your caregiver.  Soft shoe inserts or athletic shoes with air or gel sole cushions may be helpful.  If problems continue or become more severe, consult a sports medicine caregiver. Cortisone is a potent anti-inflammatory medication that may be injected into the painful area. You can discuss this treatment with your caregiver. MAKE SURE YOU:   Understand these instructions.  Will watch your condition.  Will get help right away if you are not doing well or get worse. Document Released: 09/21/2005 Document Revised: 11/07/2011 Document Reviewed: 10/16/2013 [facility] Patient Information 2015 [place], [place]. This information is not intended to replace advice given to you by your health care provider. Make sure you discuss any questions you have with your health care provider.  Plantar Fasciitis Plantar fasciitis is a common condition that causes foot pain. It is soreness (inflammation) of the  band of tough fibrous tissue on the bottom of the foot that runs from the heel bone (calcaneus) to the ball of the foot. The cause of this soreness may be from excessive standing, poor fitting shoes, running on hard surfaces, being overweight, having an abnormal walk, or overuse (this is common in runners) of the painful foot or feet. It is also common in aerobic exercise dancers and ballet dancers. SYMPTOMS  Most people with plantar fasciitis complain of:  Severe pain in the morning on the bottom of their foot especially when taking the first steps out of bed. This pain recedes after a few minutes of walking.  Severe pain is experienced also during walking following a long period of inactivity.  Pain is worse when walking barefoot or up stairs DIAGNOSIS   Your caregiver will  diagnose this condition by examining and feeling your foot.  Special tests such as X-rays of your foot, are usually not needed. PREVENTION   Consult a sports medicine professional before beginning a new exercise program.  Walking programs offer a good workout. With walking there is a lower chance of overuse injuries common to runners. There is less impact and less jarring of the joints.  Begin all new exercise programs slowly. If problems or pain develop, decrease the amount of time or distance until you are at a comfortable level.  Wear good shoes and replace them regularly.  Stretch your foot and the heel cords at the back of the ankle (Achilles tendon) both before and after exercise.  Run or exercise on even surfaces that are not hard. For example, asphalt is better than pavement.  Do not run barefoot on hard surfaces.  If using a treadmill, vary the incline.  Do not continue to workout if you have foot or joint problems. Seek professional help if they do not improve. HOME CARE INSTRUCTIONS   Avoid activities that cause you pain until you recover.  Use ice or cold packs on the problem or painful areas after working out.  Only take over-the-counter or prescription medicines for pain, discomfort, or fever as directed by your caregiver.  Soft shoe inserts or athletic shoes with air or gel sole cushions may be helpful.  If problems continue or become more severe, consult a sports medicine caregiver or your own health care provider. Cortisone is a potent anti-inflammatory medication that may be injected into the painful area. You can discuss this treatment with your caregiver. MAKE SURE YOU:   Understand these instructions.  Will watch your condition.  Will get help right away if you are not doing well or get worse. Document Released: 05/10/2001 Document Revised: 11/07/2011 Document Reviewed: 07/09/2008 [facility] Patient Information 2015 [place], [place]. This information is not  intended to replace advice given to you by your health care provider. Make sure you discuss any questions you have with your health care provider.

## 2016-03-10 ENCOUNTER — Ambulatory Visit: Payer: 59 | Admitting: Internal Medicine

## 2016-05-19 ENCOUNTER — Ambulatory Visit (INDEPENDENT_AMBULATORY_CARE_PROVIDER_SITE_OTHER): Payer: 59

## 2016-05-19 DIAGNOSIS — Z23 Encounter for immunization: Secondary | ICD-10-CM

## 2016-05-20 ENCOUNTER — Encounter: Payer: Self-pay | Admitting: Nurse Practitioner

## 2016-05-20 ENCOUNTER — Other Ambulatory Visit: Payer: 59

## 2016-05-20 ENCOUNTER — Ambulatory Visit: Payer: 59 | Admitting: Nurse Practitioner

## 2016-05-20 ENCOUNTER — Ambulatory Visit (INDEPENDENT_AMBULATORY_CARE_PROVIDER_SITE_OTHER): Payer: 59 | Admitting: Nurse Practitioner

## 2016-05-20 VITALS — BP 118/78 | HR 68 | Ht 68.0 in | Wt 256.0 lb

## 2016-05-20 DIAGNOSIS — N898 Other specified noninflammatory disorders of vagina: Secondary | ICD-10-CM

## 2016-05-20 DIAGNOSIS — N760 Acute vaginitis: Secondary | ICD-10-CM

## 2016-05-20 DIAGNOSIS — R300 Dysuria: Secondary | ICD-10-CM | POA: Diagnosis not present

## 2016-05-20 DIAGNOSIS — Z202 Contact with and (suspected) exposure to infections with a predominantly sexual mode of transmission: Secondary | ICD-10-CM

## 2016-05-20 DIAGNOSIS — B9689 Other specified bacterial agents as the cause of diseases classified elsewhere: Secondary | ICD-10-CM

## 2016-05-20 DIAGNOSIS — A5901 Trichomonal vulvovaginitis: Secondary | ICD-10-CM

## 2016-05-20 LAB — POCT URINALYSIS DIPSTICK
BILIRUBIN UA: NEGATIVE
Blood, UA: NEGATIVE
GLUCOSE UA: NEGATIVE
KETONES UA: NEGATIVE
Nitrite, UA: POSITIVE
PH UA: 6
Protein, UA: NEGATIVE
Spec Grav, UA: >=1.03
Urobilinogen, UA: 2

## 2016-05-20 LAB — POCT URINE PREGNANCY: Preg Test, Ur: NEGATIVE

## 2016-05-20 LAB — WET PREP, GENITAL: YEAST WET PREP: NONE SEEN — AB

## 2016-05-20 MED ORDER — METRONIDAZOLE 500 MG PO TABS: 2000.0000 mg | ORAL_TABLET | Freq: Once | ORAL | 0 refills | Status: DC

## 2016-05-20 MED ORDER — DOXYCYCLINE HYCLATE 100 MG PO TABS: 100.0000 mg | ORAL_TABLET | Freq: Two times a day (BID) | ORAL | 0 refills | Status: AC

## 2016-05-20 MED ORDER — METRONIDAZOLE 500 MG PO TABS: 500.0000 mg | ORAL_TABLET | Freq: Three times a day (TID) | ORAL | 0 refills | Status: DC

## 2016-05-20 MED ORDER — METRONIDAZOLE 500 MG PO TABS: 500.0000 mg | ORAL_TABLET | Freq: Once | ORAL | 0 refills | Status: DC

## 2016-05-20 NOTE — Progress Notes (Signed)
Subjective:  Patient ID: Theresa BonesJechelle L Kapur, female    DOB: 1986-07-18  Age: 30 y.o. MRN: [id number]  CC: Urinary Urgency (Pt stated have urgency, frequency going to the bathroom) and Vaginal Discharge (boyfriend positive for trichomoniasis 2days ago)   Vaginal Discharge  The patient's primary symptoms include genital itching, a genital odor and vaginal discharge. The patient's pertinent negatives include no genital lesions, genital rash, missed menses, pelvic pain or vaginal bleeding. This is a new problem. The current episode started in the past 7 days. The problem occurs constantly. The problem has been unchanged. The pain is moderate. She is not pregnant. Associated symptoms include dysuria and frequency. Pertinent negatives include no abdominal pain, anorexia, back pain, chills, diarrhea, discolored urine, fever, flank pain, hematuria, painful intercourse or urgency. The vaginal discharge was thick and white. There has been no bleeding. She has tried nothing for the symptoms. She is sexually active. No, her partner does not have an STD. She uses nothing for contraception. Her menstrual history has been regular.    Outpatient Medications Prior to Visit  Medication Sig Dispense Refill  . diclofenac (CATAFLAM) 50 MG tablet Take 1 tablet (50 mg total) by mouth 3 (three) times daily. One tablet TID with food prn pain. 21 tablet 0  . famotidine (PEPCID) 40 MG tablet Take 1 tablet (40 mg total) by mouth daily. 90 tablet 1   No facility-administered medications prior to visit.     ROS See HPI  Objective:  BP 118/78 (BP Location: Left Arm, Patient Position: Sitting, Cuff Size: Normal)   Pulse 68   Ht 5\' 8"  (1.727 m)   Wt 256 lb (116.1 kg)   SpO2 98%   BMI 38.92 kg/m   BP Readings from Last 3 Encounters:  05/20/16 118/78  05/18/15 136/98  02/12/15 109/72    Wt Readings from Last 3 Encounters:  05/20/16 256 lb (116.1 kg)  02/12/15 248 lb (112.5 kg)  01/27/15 250 lb (113.4 kg)     Physical Exam  Constitutional: She is oriented to person, place, and time.  Pulmonary/Chest: Effort normal.  Abdominal: Soft. Bowel sounds are normal. She exhibits no distension. There is no tenderness.  Genitourinary: Rectal exam shows no external hemorrhoid and no internal hemorrhoid. Cervix exhibits discharge and friability. Cervix exhibits no motion tenderness. No erythema or tenderness in the vagina. Vaginal discharge found.  Musculoskeletal: She exhibits no edema.  Neurological: She is alert and oriented to person, place, and time.  Skin: Skin is warm and dry.    Lab Results  Component Value Date   WBC 6.5 01/27/2015   HGB 14.5 01/27/2015   HCT 42.3 01/27/2015   PLT 231.0 01/27/2015   GLUCOSE 91 01/27/2015   CHOL 188 01/27/2015   TRIG 79.0 01/27/2015   HDL 40.60 01/27/2015   LDLCALC 132 (H) 01/27/2015   ALT 72 (H) 01/27/2015   AST 46 (H) 01/27/2015   NA 137 01/27/2015   K 4.4 01/27/2015   CL 106 01/27/2015   CREATININE 0.69 01/27/2015   BUN 11 01/27/2015   CO2 25 01/27/2015   TSH 1.53 01/27/2015    Assessment & Plan:   [name] was seen today for urinary urgency and vaginal discharge.  Diagnoses and all orders for this visit:  Dysuria -     Wet prep, genital; Future -     Urine culture -     GC/chlamydia probe amp, urine; Future -     POCT urine pregnancy -  doxycycline (VIBRA-TABS) 100 MG tablet; Take 1 tablet (100 mg total) by mouth 2 (two) times daily.  Vaginal discharge -     POCT Urinalysis Dipstick -     Wet prep, genital; Future -     Urine culture -     GC/chlamydia probe amp, urine; Future -     Discontinue: metroNIDAZOLE (FLAGYL) 500 MG tablet; Take 1 tablet (500 mg total) by mouth once. -     POCT urine pregnancy -     metroNIDAZOLE (FLAGYL) 500 MG tablet; Take 4 tablets (2,000 mg total) by mouth once. -     doxycycline (VIBRA-TABS) 100 MG tablet; Take 1 tablet (100 mg total) by mouth 2 (two) times daily.  Exposure to sexually  transmitted disease (STD) -     Wet prep, genital; Future -     Urine culture -     GC/chlamydia probe amp, urine; Future -     Discontinue: metroNIDAZOLE (FLAGYL) 500 MG tablet; Take 1 tablet (500 mg total) by mouth once. -     HIV antibody -     metroNIDAZOLE (FLAGYL) 500 MG tablet; Take 4 tablets (2,000 mg total) by mouth once. -     doxycycline (VIBRA-TABS) 100 MG tablet; Take 1 tablet (100 mg total) by mouth 2 (two) times daily.   I have discontinued Ms. Limones's famotidine and diclofenac. I have also changed her metroNIDAZOLE. Additionally, I am having her start on doxycycline.  Meds ordered this encounter  Medications  . DISCONTD: metroNIDAZOLE (FLAGYL) 500 MG tablet    Sig: Take 1 tablet (500 mg total) by mouth once.    Dispense:  4 tablet    Refill:  0    Order Specific Question:   Supervising Provider    Answer:   Tresa Garter [[id number]]  . metroNIDAZOLE (FLAGYL) 500 MG tablet    Sig: Take 4 tablets (2,000 mg total) by mouth once.    Dispense:  4 tablet    Refill:  0    Order Specific Question:   Supervising Provider    Answer:   Tresa Garter [[id number]]  . doxycycline (VIBRA-TABS) 100 MG tablet    Sig: Take 1 tablet (100 mg total) by mouth 2 (two) times daily.    Dispense:  14 tablet    Refill:  0    Order Specific Question:   Supervising Provider    Answer:   Tresa Garter [[id number]]    Follow-up: Return if symptoms worsen or fail to improve.  Alysia Penna, NP

## 2016-05-20 NOTE — Progress Notes (Signed)
Reviewed with patient in office. See office note

## 2016-05-20 NOTE — Progress Notes (Signed)
Pre visit review using our clinic review tool, if applicable. No additional management support is needed unless otherwise documented below in the visit note. 

## 2016-05-21 LAB — GC/CHLAMYDIA PROBE AMP
CT PROBE, AMP APTIMA: NOT DETECTED
GC PROBE AMP APTIMA: NOT DETECTED

## 2016-05-23 NOTE — Progress Notes (Signed)
Normal results, see office note

## 2016-06-14 ENCOUNTER — Ambulatory Visit (INDEPENDENT_AMBULATORY_CARE_PROVIDER_SITE_OTHER): Payer: 59 | Admitting: Internal Medicine

## 2016-06-14 ENCOUNTER — Encounter: Payer: Self-pay | Admitting: Internal Medicine

## 2016-06-14 ENCOUNTER — Other Ambulatory Visit (INDEPENDENT_AMBULATORY_CARE_PROVIDER_SITE_OTHER): Payer: 59

## 2016-06-14 VITALS — BP 110/70 | HR 81 | Temp 98.0°F | Resp 16 | Ht 68.0 in | Wt 256.5 lb

## 2016-06-14 DIAGNOSIS — R7989 Other specified abnormal findings of blood chemistry: Secondary | ICD-10-CM

## 2016-06-14 DIAGNOSIS — Z124 Encounter for screening for malignant neoplasm of cervix: Secondary | ICD-10-CM | POA: Insufficient documentation

## 2016-06-14 DIAGNOSIS — R945 Abnormal results of liver function studies: Secondary | ICD-10-CM

## 2016-06-14 DIAGNOSIS — K760 Fatty (change of) liver, not elsewhere classified: Secondary | ICD-10-CM

## 2016-06-14 DIAGNOSIS — Z0000 Encounter for general adult medical examination without abnormal findings: Secondary | ICD-10-CM

## 2016-06-14 LAB — LIPID PANEL
Cholesterol: 189 mg/dL (ref 0–200)
HDL: 38.3 mg/dL — ABNORMAL LOW (ref >39.00)
LDL CALC: 129 mg/dL — AB (ref 0–99)
NONHDL: 150.71
Total CHOL/HDL Ratio: 5
Triglycerides: 110 mg/dL (ref 0.0–149.0)
VLDL: 22 mg/dL (ref 0.0–40.0)

## 2016-06-14 LAB — CBC WITH DIFFERENTIAL/PLATELET
BASOS ABS: 0 10*3/uL (ref 0.0–0.1)
Basophils Relative: 0.6 % (ref 0.0–3.0)
EOS PCT: 1.1 % (ref 0.0–5.0)
Eosinophils Absolute: 0.1 10*3/uL (ref 0.0–0.7)
HCT: 40.6 % (ref 36.0–46.0)
HEMOGLOBIN: 13.9 g/dL (ref 12.0–15.0)
LYMPHS PCT: 37 % (ref 12.0–46.0)
Lymphs Abs: 2.2 10*3/uL (ref 0.7–4.0)
MCHC: 34.3 g/dL (ref 30.0–36.0)
MCV: 88.6 fl (ref 78.0–100.0)
Monocytes Absolute: 0.4 10*3/uL (ref 0.1–1.0)
Monocytes Relative: 6.2 % (ref 3.0–12.0)
Neutro Abs: 3.2 10*3/uL (ref 1.4–7.7)
Neutrophils Relative %: 55.1 % (ref 43.0–77.0)
Platelets: 237 10*3/uL (ref 150.0–400.0)
RBC: 4.59 Mil/uL (ref 3.87–5.11)
RDW: 13.9 % (ref 11.5–15.5)
WBC: 5.9 10*3/uL (ref 4.0–10.5)

## 2016-06-14 LAB — COMPREHENSIVE METABOLIC PANEL
ALK PHOS: 127 U/L — AB (ref 39–117)
ALT: 69 U/L — ABNORMAL HIGH (ref 0–35)
AST: 33 U/L (ref 0–37)
Albumin: 4.4 g/dL (ref 3.5–5.2)
BILIRUBIN TOTAL: 0.5 mg/dL (ref 0.2–1.2)
BUN: 10 mg/dL (ref 6–23)
CALCIUM: 9.6 mg/dL (ref 8.4–10.5)
CO2: 27 mEq/L (ref 19–32)
Chloride: 102 mEq/L (ref 96–112)
Creatinine, Ser: 0.65 mg/dL (ref 0.40–1.20)
GFR: 138.69 mL/min (ref >60.00)
Glucose, Bld: 83 mg/dL (ref 70–99)
POTASSIUM: 3.9 meq/L (ref 3.5–5.1)
Sodium: 136 mEq/L (ref 135–145)
Total Protein: 8.1 g/dL (ref 6.0–8.3)

## 2016-06-14 LAB — PROTIME-INR
INR: 1.1 ratio — ABNORMAL HIGH (ref 0.8–1.0)
Prothrombin Time: 11.4 s (ref 9.6–13.1)

## 2016-06-14 LAB — HCG, QUANTITATIVE, PREGNANCY: Quantitative HCG: 0.08 m[IU]/mL

## 2016-06-14 LAB — TSH: TSH: 2.07 u[IU]/mL (ref 0.35–4.50)

## 2016-06-14 NOTE — Progress Notes (Signed)
Pre visit review using our clinic review tool, if applicable. No additional management support is needed unless otherwise documented below in the visit note. 

## 2016-06-14 NOTE — Progress Notes (Signed)
Subjective:  Patient ID: Theresa Trevino, female    DOB: [birth year]  Age: 30 y.o. MRN: [id number]  CC: Annual Exam and Abdominal Pain   HPI Theresa Trevino presents for a CPX and concerns about recurrent episodes of abdominal pain off and on for the last year. She had an abdominal ultrasound done about a year ago that showed that her gallbladder was normal with no stones but she does have fatty liver disease. She has intermittent discomfort in her right upper quadrant and epigastric region after eating. She denies any episodes of nausea, vomiting, diarrhea, constipation, weight loss, or hematuria. Working on her lifestyle modifications to lose weight and improve the fatty liver findings on the ultrasound.  Outpatient Medications Prior to Visit  Medication Sig Dispense Refill  . metroNIDAZOLE (FLAGYL) 500 MG tablet Take 1 tablet (500 mg total) by mouth 3 (three) times daily. Take with food 21 tablet 0   No facility-administered medications prior to visit.     ROS Review of Systems  Constitutional: Negative.  Negative for activity change, appetite change, chills, diaphoresis, fatigue, fever and unexpected weight change.  HENT: Negative.  Negative for sore throat.   Eyes: Negative for visual disturbance.  Respiratory: Negative.  Negative for cough, choking, chest tightness, shortness of breath and stridor.   Cardiovascular: Negative.  Negative for chest pain, palpitations and leg swelling.  Gastrointestinal: Positive for abdominal pain. Negative for blood in stool, constipation, diarrhea, nausea and vomiting.  Endocrine: Negative.  Negative for cold intolerance and heat intolerance.  Genitourinary: Negative.  Negative for decreased urine volume, difficulty urinating, dysuria, frequency, hematuria and urgency.  Musculoskeletal: Negative for arthralgias, back pain and neck pain.  Skin: Negative.  Negative for color change, pallor and rash.  Allergic/Immunologic: Negative.   Neurological:  Negative.  Negative for dizziness, tremors, weakness and light-headedness.  Hematological: Negative for adenopathy. Does not bruise/bleed easily.  Psychiatric/Behavioral: Negative.     Objective:  BP 110/70 (BP Location: Left Arm, Patient Position: Sitting, Cuff Size: Large)   Pulse 81   Temp 98 F (36.7 C) (Oral)   Resp 16   Ht 5\' 8"  (1.727 m)   Wt 256 lb 8 oz (116.3 kg)   LMP 05/24/2016   SpO2 98%   BMI 39.00 kg/m   BP Readings from Last 3 Encounters:  06/14/16 110/70  05/20/16 118/78  05/18/15 136/98    Wt Readings from Last 3 Encounters:  06/14/16 256 lb 8 oz (116.3 kg)  05/20/16 256 lb (116.1 kg)  02/12/15 248 lb (112.5 kg)    Physical Exam  Constitutional: She is oriented to person, place, and time.  Non-toxic appearance. She does not have a sickly appearance. She does not appear ill. No distress.  HENT:  Mouth/Throat: Oropharynx is clear and moist. No oropharyngeal exudate.  Eyes: Conjunctivae are normal. Right eye exhibits no discharge. Left eye exhibits no discharge. No scleral icterus.  Neck: Normal range of motion. Neck supple. No JVD present. No tracheal deviation present. No thyromegaly present.  Cardiovascular: Normal rate, regular rhythm, normal heart sounds and intact distal pulses.  Exam reveals no gallop and no friction rub.   No murmur heard. Pulmonary/Chest: Effort normal and breath sounds normal. No stridor. No respiratory distress. She has no wheezes. She has no rales. She exhibits no tenderness.  Abdominal: Soft. Bowel sounds are normal. She exhibits no distension and no mass. There is no tenderness. There is no rebound and no guarding.  Musculoskeletal: Normal range of motion. She  exhibits no edema, tenderness or deformity.  Lymphadenopathy:    She has no cervical adenopathy.  Neurological: She is oriented to person, place, and time.  Skin: Skin is warm and dry. No rash noted. She is not diaphoretic. No erythema. No pallor.  Psychiatric: She has a  normal mood and affect. Her behavior is normal. Judgment and thought content normal.  Vitals reviewed.   Lab Results  Component Value Date   WBC 5.9 06/14/2016   HGB 13.9 06/14/2016   HCT 40.6 06/14/2016   PLT 237.0 06/14/2016   GLUCOSE 83 06/14/2016   CHOL 189 06/14/2016   TRIG 110.0 06/14/2016   HDL 38.30 (L) 06/14/2016   LDLCALC 129 (H) 06/14/2016   ALT 69 (H) 06/14/2016   AST 33 06/14/2016   NA 136 06/14/2016   K 3.9 06/14/2016   CL 102 06/14/2016   CREATININE 0.65 06/14/2016   BUN 10 06/14/2016   CO2 27 06/14/2016   TSH 2.07 06/14/2016   INR 1.1 (H) 06/14/2016    Dg Foot Complete Left  Result Date: 05/18/2015 CLINICAL DATA:  Left heel pain following injury of uncertain nature EXAM: LEFT FOOT - COMPLETE 3+ VIEW COMPARISON:  None. FINDINGS: Small calcaneal plantar spur is noted. No acute fracture or dislocation is seen. No gross soft tissue abnormality is noted. IMPRESSION: No acute abnormality noted. Electronically Signed   By: Alcide Clever M.D.   On: 05/18/2015 18:25    Assessment & Plan:   Theresa Trevino was seen today for annual exam and abdominal pain.  Diagnoses and all orders for this visit:  Routine general medical examination at a health care facility- Exam completed, labs ordered and reviewed, vaccines reviewed and updated, she wants to see a gynecologist for a Pap smear, patient education material was given. -     HIV antibody; Future -     Comprehensive metabolic panel; Future -     CBC with Differential/Platelet; Future -     Lipid panel; Future -     TSH; Future -     hCG, quantitative, pregnancy; Future  Fatty liver disease, nonalcoholic- her liver enzymes are somewhat improved with lifestyle modifications. I have asked her to start the vaccine process for hepatitis A. -     Hepatitis A antibody, total; Future -     Hepatitis B core antibody, total; Future -     Hepatitis B surface antibody; Future -     Hepatitis C antibody; Future -     Protime-INR;  Future  Elevated LFTs- she has immunity to hepatitis B from prior vaccination, she is hepatitis A antibody and hepatitis C antibody negative. I've asked her to come in for the hepatitis A vaccine series and have advised her how to avoid hepatitis C infection. -     Hepatitis A antibody, total; Future -     Hepatitis B core antibody, total; Future -     Hepatitis B surface antibody; Future -     Hepatitis C antibody; Future -     Protime-INR; Future  Cervical cancer screening -     Ambulatory referral to Gynecology   I have discontinued Ms. Gomm's metroNIDAZOLE.  No orders of the defined types were placed in this encounter.    Follow-up: Return in about 3 months (around 09/14/2016).  Sanda Linger, MD

## 2016-06-14 NOTE — Patient Instructions (Signed)
Fatty Liver  Fatty liver, also called hepatic steatosis or steatohepatitis, is a condition in which too much fat has built up in your liver cells. The liver removes harmful substances from your bloodstream. It produces fluids your body needs. It also helps your body use and store energy from the food you eat.  In many cases, fatty liver does not cause symptoms or problems. It is often diagnosed when tests are being done for other reasons. However, over time, fatty liver can cause inflammation that may lead to more serious liver problems, such as scarring of the liver (cirrhosis).  CAUSES   Causes of fatty liver may include:    Drinking too much alcohol.   Poor nutrition.   Obesity.   Cushing syndrome.   Diabetes.   Hyperlipidemia.   Pregnancy.   Certain drugs.   Poisons.   Some viral infections.  RISK FACTORS  You may be more likely to develop fatty liver if you:   Abuse alcohol.   Are pregnant.   Are overweight.   Have diabetes.   Have hepatitis.   Have a high triglyceride level.   SIGNS AND SYMPTOMS   Fatty liver often does not cause any symptoms. In cases where symptoms develop, they can include:   Fatigue.   Weakness.   Weight loss.   Confusion.    Abdominal pain.   Yellowing of your skin and the white parts of your eyes (jaundice).   Nausea and vomiting.  DIAGNOSIS   Fatty liver may be diagnosed by:    Physical exam and medical history.   Blood tests.   Imaging tests, such as an ultrasound, CT scan, or MRI.   Liver biopsy. A small sample of liver tissue is removed using a needle. The sample is then looked at under a microscope.  TREATMENT   Fatty liver is often caused by other health conditions. Treatment for fatty liver may involve medicines and lifestyle changes to manage conditions such as:    Alcoholism.   High cholesterol.   Diabetes.   Being overweight or obese.   HOME CARE INSTRUCTIONS   Eat a healthy diet as directed by your health care provider.   Exercise regularly.  This can help you lose weight and control your cholesterol and diabetes. Talk to your health care provider about an exercise plan and which activities are best for you.   Do not drink alcohol.    Take medicines only as directed by your health care provider.  SEEK MEDICAL CARE IF:  You have difficulty controlling your:   Blood sugar.   Cholesterol.   Alcohol consumption.  SEEK IMMEDIATE MEDICAL CARE IF:   You have abdominal pain.   You have jaundice.   You have nausea and vomiting.     This information is not intended to replace advice given to you by your health care provider. Make sure you discuss any questions you have with your health care provider.     Document Released: 09/30/2005 Document Revised: 09/05/2014 Document Reviewed: 12/25/2013  Elsevier Interactive Patient Education 2016 [employer]

## 2016-06-15 ENCOUNTER — Encounter: Payer: Self-pay | Admitting: Internal Medicine

## 2016-06-15 LAB — HIV ANTIBODY (ROUTINE TESTING W REFLEX): HIV: NONREACTIVE

## 2016-06-15 LAB — HEPATITIS B SURFACE ANTIBODY,QUALITATIVE: Hep B S Ab: POSITIVE — AB

## 2016-06-15 LAB — HEPATITIS C ANTIBODY: HCV Ab: NEGATIVE

## 2016-06-15 LAB — HEPATITIS A ANTIBODY, TOTAL: Hep A Total Ab: NONREACTIVE

## 2016-06-15 LAB — HEPATITIS B CORE ANTIBODY, TOTAL: Hep B Core Total Ab: NONREACTIVE

## 2016-06-20 ENCOUNTER — Telehealth: Payer: Self-pay | Admitting: Obstetrics and Gynecology

## 2016-06-20 NOTE — Telephone Encounter (Signed)
Called but could not leave a message for patient to call back to schedule a new patient doctor referral due to mailbox full.

## 2016-06-28 ENCOUNTER — Ambulatory Visit (INDEPENDENT_AMBULATORY_CARE_PROVIDER_SITE_OTHER): Payer: 59 | Admitting: Certified Nurse Midwife

## 2016-06-28 ENCOUNTER — Encounter: Payer: Self-pay | Admitting: Certified Nurse Midwife

## 2016-06-28 VITALS — BP 116/72 | HR 70 | Resp 16 | Ht 67.25 in | Wt 260.0 lb

## 2016-06-28 DIAGNOSIS — Z01419 Encounter for gynecological examination (general) (routine) without abnormal findings: Secondary | ICD-10-CM | POA: Diagnosis not present

## 2016-06-28 DIAGNOSIS — Z30018 Encounter for initial prescription of other contraceptives: Secondary | ICD-10-CM

## 2016-06-28 DIAGNOSIS — Z0000 Encounter for general adult medical examination without abnormal findings: Secondary | ICD-10-CM

## 2016-06-28 DIAGNOSIS — Z3009 Encounter for other general counseling and advice on contraception: Secondary | ICD-10-CM | POA: Diagnosis not present

## 2016-06-28 DIAGNOSIS — Z124 Encounter for screening for malignant neoplasm of cervix: Secondary | ICD-10-CM | POA: Diagnosis not present

## 2016-06-28 MED ORDER — ETONOGESTREL-ETHINYL ESTRADIOL 0.12-0.015 MG/24HR VA RING: 1.0000 | VAGINAL_RING | VAGINAL | 12 refills | Status: DC

## 2016-06-28 NOTE — Progress Notes (Signed)
30 y.o. G0P0000 Single  African American Fe here to establish gyn care and  for annual exam. Previous Depo Provera for almost for 10 years, and then went off. Now has been off for the past 3 years. Periods now are regular 24-36 day cycles, duration 5 days, heavy to light/spotting. Sexually active, no contraception use, but desires information on. Had tried OCP and patch and did not do well with either one, during teenage years. Some weight gain with Depo Provera, but stayed the same the last time used. Diagnosed with trichomonas in 9/17 and was treated. No TOC since treated. Not interested in IUD or Nexplanon. No other health issues today.  Patient's last menstrual period was 06/23/2016 (exact date).          Sexually active: Yes.    The current method of family planning is none.    Exercising: No.  exercise Smoker:  no  Health Maintenance: Pap:  7/14 neg per patient, no abnormal pap smers MMG:  none Colonoscopy:  none BMD:   none TDaP:  12-05-07 Shingles: zoster 2012 per mychart Pneumonia: none Hep C and HIV: both neg 2017 Labs: pcp Self breast exam: done occ   reports that she has never smoked. She has never used smokeless tobacco. She reports that she drinks alcohol. She reports that she does not use drugs.  Past Medical History:  Diagnosis Date  . History of chicken pox   . Hx: UTI (urinary tract infection)   . STD (sexually transmitted disease)    trichomonas    History reviewed. No pertinent surgical history.  No current outpatient prescriptions on file.   No current facility-administered medications for this visit.     Family History  Problem Relation Age of Onset  . Sarcoidosis Maternal Grandmother   . Colon cancer Maternal Grandfather     ROS:  Pertinent items are noted in HPI.  Otherwise, a comprehensive ROS was negative.  Exam:   BP 116/72   Pulse 70   Resp 16   Ht 5' 7.25" (1.708 m)   Wt 260 lb (117.9 kg)   LMP 06/23/2016 (Exact Date) Comment: some  spotting today  BMI 40.42 kg/m  Height: 5' 7.25" (170.8 cm) Ht Readings from Last 3 Encounters:  06/28/16 5' 7.25" (1.708 m)  06/14/16 5\' 8"  (1.727 m)  05/20/16 5\' 8"  (1.727 m)    General appearance: alert, cooperative and appears stated age Head: Normocephalic, without obvious abnormality, atraumatic Neck: no adenopathy, supple, symmetrical, trachea midline and thyroid normal to inspection and palpation Lungs: clear to auscultation bilaterally Breasts: normal appearance, no masses or tenderness, No nipple retraction or dimpling, No nipple discharge or bleeding, No axillary or supraclavicular adenopathy Heart: regular rate and rhythm Abdomen: soft, non-tender; no masses,  no organomegaly Extremities: extremities normal, atraumatic, no cyanosis or edema Skin: Skin color, texture, turgor normal. No rashes or lesions Lymph nodes: Cervical, supraclavicular, and axillary nodes normal. No abnormal inguinal nodes palpated Neurologic: Grossly normal   Pelvic: External genitalia:  no lesions              Urethra:  normal appearing urethra with no masses, tenderness or lesions              Bartholin's and Skene's: normal                 Vagina: normal appearing vagina with normal color and discharge, no lesions, Patient inserted and removed Nuvaring with provider checking with out problems  Cervix: no bleeding following Pap, no cervical motion tenderness and no lesions              Pap taken: Yes.   Bimanual Exam:  Uterus:  normal size, contour, position, consistency, mobility, non-tender and anteverted              Adnexa: normal adnexa and no mass, fullness, tenderness               Rectovaginal: Confirms               Anus:  normal sphincter tone, no lesions  Chaperone present: yes  A:  Well Woman with normal exam  Contraception options desired  History of trichomonas treated, but no TOC  History of Fatty liver disease under PCP follow up, no treatment at this  point.      P:   Reviewed health and wellness pertinent to exam  Discussed risks/benefits of OCP and Nuvaring , Nexplanon and IUD. Prefers Nuvaring.  Rx Nuvaring see order with instructions and warning signs reviewed. Will need 3 month recheck for surveillance  Affirm collected, will treat indicated  Pap smear as above with HPV reflex   counseled on breast self exam, STD prevention, HIV risk factors and prevention, adequate intake of calcium and vitamin D, diet and exercise  return annually or prn  An After Visit Summary was printed and given to the patient.

## 2016-06-28 NOTE — Patient Instructions (Signed)
EXERCISE AND DIET:  We recommended that you start or continue a regular exercise program for good health. Regular exercise means any activity that makes your heart beat faster and makes you sweat.  We recommend exercising at least 30 minutes per day at least 3 days a week, preferably 4 or 5.  We also recommend a diet low in fat and sugar.  Inactivity, poor dietary choices and obesity can cause diabetes, heart attack, stroke, and kidney damage, among others.    ALCOHOL AND SMOKING:  Women should limit their alcohol intake to no more than 7 drinks/beers/glasses of wine (combined, not each!) per week. Moderation of alcohol intake to this level decreases your risk of breast cancer and liver damage. And of course, no recreational drugs are part of a healthy lifestyle.  And absolutely no smoking or even second hand smoke. Most people know smoking can cause heart and lung diseases, but did you know it also contributes to weakening of your bones? Aging of your skin?  Yellowing of your teeth and nails?  CALCIUM AND VITAMIN D:  Adequate intake of calcium and Vitamin D are recommended.  The recommendations for exact amounts of these supplements seem to change often, but generally speaking 600 mg of calcium (either carbonate or citrate) and 800 units of Vitamin D per day seems prudent. Certain women may benefit from higher intake of Vitamin D.  If you are among these women, your doctor will have told you during your visit.    PAP SMEARS:  Pap smears, to check for cervical cancer or precancers,  have traditionally been done yearly, although recent scientific advances have shown that most women can have pap smears less often.  However, every woman still should have a physical exam from her gynecologist every year. It will include a breast check, inspection of the vulva and vagina to check for abnormal growths or skin changes, a visual exam of the cervix, and then an exam to evaluate the size and shape of the uterus and  ovaries.  And after 29 years of age, a rectal exam is indicated to check for rectal cancers. We will also provide age appropriate advice regarding health maintenance, like when you should have certain vaccines, screening for sexually transmitted diseases, bone density testing, colonoscopy, mammograms, etc.   MAMMOGRAMS:  All women over 40 years old should have a yearly mammogram. Many facilities now offer a "3D" mammogram, which may cost around $50 extra out of pocket. If possible,  we recommend you accept the option to have the 3D mammogram performed.  It both reduces the number of women who will be called back for extra views which then turn out to be normal, and it is better than the routine mammogram at detecting truly abnormal areas.    COLONOSCOPY:  Colonoscopy to screen for colon cancer is recommended for all women at age 50.  We know, you hate the idea of the prep.  We agree, BUT, having colon cancer and not knowing it is worse!!  Colon cancer so often starts as a polyp that can be seen and removed at colonscopy, which can quite literally save your life!  And if your first colonoscopy is normal and you have no family history of colon cancer, most women don't have to have it again for 10 years.  Once every ten years, you can do something that may end up saving your life, right?  We will be happy to help you get it scheduled when you are ready.    Be sure to check your insurance coverage so you understand how much it will cost.  It may be covered as a preventative service at no cost, but you should check your particular policy.     Ethinyl Estradiol; Etonogestrel vaginal ring What is this medicine? ETHINYL ESTRADIOL; ETONOGESTREL (ETH in il es tra DYE ole; et oh noe JES trel) vaginal ring is a flexible, vaginal ring used as a contraceptive (birth control method). This medicine combines two types of female hormones, an estrogen and a progestin. This ring is used to prevent ovulation and pregnancy. Each  ring is effective for one month. This medicine may be used for other purposes; ask your health care provider or pharmacist if you have questions. What should I tell my health care provider before I take this medicine? They need to know if you have or ever had any of these conditions: -abnormal vaginal bleeding -blood vessel disease or blood clots -breast, cervical, endometrial, ovarian, liver, or uterine cancer -diabetes -gallbladder disease -heart disease or recent heart attack -high blood pressure -high cholesterol -kidney disease -liver disease -migraine headaches -stroke -systemic lupus erythematosus (SLE) -tobacco smoker -an unusual or allergic reaction to estrogens, progestins, other medicines, foods, dyes, or preservatives -pregnant or trying to get pregnant -breast-feeding How should I use this medicine? Insert the ring into your vagina as directed. Follow the directions on the prescription label. The ring will remain place for 3 weeks and is then removed for a 1-week break. A new ring is inserted 1 week after the last ring was removed, on the same day of the week. Do not use more often than directed. A patient package insert for the product will be given with each prescription and refill. Read this sheet carefully each time. The sheet may change frequently. Contact your pediatrician regarding the use of this medicine in children. Special care may be needed. This medicine has been used in female children who have started having menstrual periods. Overdosage: If you think you have taken too much of this medicine contact a poison control center or emergency room at once. NOTE: This medicine is only for you. Do not share this medicine with others. What if I miss a dose? You will need to replace your vaginal ring once a month as directed. If the ring should slip out, or if you leave it in longer or shorter than you should, contact your health care professional for advice. What may  interact with this medicine? -acetaminophen -antibiotics or medicines for infections, especially rifampin, rifabutin, rifapentine, and griseofulvin, and possibly penicillins or tetracyclines -aprepitant -ascorbic acid (vitamin C) -atorvastatin -barbiturate medicines, such as phenobarbital -bosentan -carbamazepine -caffeine -clofibrate -cyclosporine -dantrolene -doxercalciferol -felbamate -grapefruit juice -hydrocortisone -medicines for anxiety or sleeping problems, such as diazepam or temazepam -medicines for diabetes, including pioglitazone -modafinil -mycophenolate -nefazodone -oxcarbazepine -phenytoin -prednisolone -ritonavir or other medicines for HIV infection or AIDS -rosuvastatin -selegiline -soy isoflavones supplements -St. John's wort -tamoxifen or raloxifene -theophylline -thyroid hormones -topiramate -warfarin This list may not describe all possible interactions. Give your health care provider a list of all the medicines, herbs, non-prescription drugs, or dietary supplements you use. Also tell them if you smoke, drink alcohol, or use illegal drugs. Some items may interact with your medicine. What should I watch for while using this medicine? Visit your doctor or health care professional for regular checks on your progress. You will need a regular breast and pelvic exam and Pap smear while on this medicine. Use an additional method of contraception   during the first cycle that you use this ring. If you have any reason to think you are pregnant, stop using this medicine right away and contact your doctor or health care professional. If you are using this medicine for hormone related problems, it may take several cycles of use to see improvement in your condition. Smoking increases the risk of getting a blood clot or having a stroke while you are using hormonal birth control, especially if you are more than 29 years old. You are strongly advised not to smoke. This  medicine can make your body retain fluid, making your fingers, hands, or ankles swell. Your blood pressure can go up. Contact your doctor or health care professional if you feel you are retaining fluid. This medicine can make you more sensitive to the sun. Keep out of the sun. If you cannot avoid being in the sun, wear protective clothing and use sunscreen. Do not use sun lamps or tanning beds/booths. If you wear contact lenses and notice visual changes, or if the lenses begin to feel uncomfortable, consult your eye care specialist. In some women, tenderness, swelling, or minor bleeding of the gums may occur. Notify your dentist if this happens. Brushing and flossing your teeth regularly may help limit this. See your dentist regularly and inform your dentist of the medicines you are taking. If you are going to have elective surgery, you may need to stop using this medicine before the surgery. Consult your health care professional for advice. This medicine does not protect you against HIV infection (AIDS) or any other sexually transmitted diseases. What side effects may I notice from receiving this medicine? Side effects that you should report to your doctor or health care professional as soon as possible: -breast tissue changes or discharge -changes in vaginal bleeding during your period or between your periods -chest pain -coughing up blood -dizziness or fainting spells -headaches or migraines -leg, arm or groin pain -severe or sudden headaches -stomach pain (severe) -sudden shortness of breath -sudden loss of coordination, especially on one side of the body -speech problems -symptoms of vaginal infection like itching, irritation or unusual discharge -tenderness in the upper abdomen -vomiting -weakness or numbness in the arms or legs, especially on one side of the body -yellowing of the eyes or skin Side effects that usually do not require medical attention (report to your doctor or health  care professional if they continue or are bothersome): -breakthrough bleeding and spotting that continues beyond the 3 initial cycles of pills -breast tenderness -mood changes, anxiety, depression, frustration, anger, or emotional outbursts -increased sensitivity to sun or ultraviolet light -nausea -skin rash, acne, or brown spots on the skin -weight gain (slight) This list may not describe all possible side effects. Call your doctor for medical advice about side effects. You may report side effects to FDA at 1-800-FDA-1088. Where should I keep my medicine? Keep out of the reach of children. Store at room temperature between 15 and 30 degrees C (59 and 86 degrees F) for up to 4 months. The product will expire after 4 months. Protect from light. Throw away any unused medicine after the expiration date. NOTE: This sheet is a summary. It may not cover all possible information. If you have questions about this medicine, talk to your doctor, pharmacist, or health care provider.    2016, Elsevier/[employer] Standard. (2008-07-31 12:03:58)  

## 2016-06-29 ENCOUNTER — Other Ambulatory Visit: Payer: Self-pay | Admitting: Certified Nurse Midwife

## 2016-06-29 DIAGNOSIS — N760 Acute vaginitis: Principal | ICD-10-CM

## 2016-06-29 DIAGNOSIS — B9689 Other specified bacterial agents as the cause of diseases classified elsewhere: Secondary | ICD-10-CM

## 2016-06-29 LAB — WET PREP BY MOLECULAR PROBE
CANDIDA SPECIES: NEGATIVE
GARDNERELLA VAGINALIS: POSITIVE — AB
TRICHOMONAS VAG: NEGATIVE

## 2016-06-29 MED ORDER — METRONIDAZOLE 0.75 % VA GEL: 1.0000 | Freq: Two times a day (BID) | VAGINAL | 0 refills | Status: DC

## 2016-06-30 LAB — IPS PAP TEST WITH REFLEX TO HPV

## 2016-07-04 LAB — HM PAP SMEAR

## 2016-07-04 NOTE — Progress Notes (Signed)
Encounter reviewed Theresa Azar, MD   

## 2016-09-13 ENCOUNTER — Encounter: Payer: Self-pay | Admitting: Internal Medicine

## 2016-09-13 ENCOUNTER — Other Ambulatory Visit (INDEPENDENT_AMBULATORY_CARE_PROVIDER_SITE_OTHER): Payer: 59

## 2016-09-13 ENCOUNTER — Ambulatory Visit (INDEPENDENT_AMBULATORY_CARE_PROVIDER_SITE_OTHER): Payer: 59 | Admitting: Internal Medicine

## 2016-09-13 VITALS — BP 122/86 | HR 76 | Temp 98.3°F | Resp 16 | Ht 67.0 in | Wt 261.0 lb

## 2016-09-13 DIAGNOSIS — K760 Fatty (change of) liver, not elsewhere classified: Secondary | ICD-10-CM | POA: Diagnosis not present

## 2016-09-13 DIAGNOSIS — R1011 Right upper quadrant pain: Secondary | ICD-10-CM

## 2016-09-13 DIAGNOSIS — G8929 Other chronic pain: Secondary | ICD-10-CM | POA: Diagnosis not present

## 2016-09-13 LAB — CBC WITH DIFFERENTIAL/PLATELET
BASOS ABS: 0 10*3/uL (ref 0.0–0.1)
Basophils Relative: 0.6 % (ref 0.0–3.0)
EOS ABS: 0.1 10*3/uL (ref 0.0–0.7)
Eosinophils Relative: 1.2 % (ref 0.0–5.0)
HCT: 39.5 % (ref 36.0–46.0)
Hemoglobin: 13.4 g/dL (ref 12.0–15.0)
LYMPHS ABS: 2.3 10*3/uL (ref 0.7–4.0)
Lymphocytes Relative: 44.2 % (ref 12.0–46.0)
MCHC: 33.9 g/dL (ref 30.0–36.0)
MCV: 87.9 fl (ref 78.0–100.0)
MONO ABS: 0.3 10*3/uL (ref 0.1–1.0)
MONOS PCT: 5.6 % (ref 3.0–12.0)
NEUTROS PCT: 48.4 % (ref 43.0–77.0)
Neutro Abs: 2.5 10*3/uL (ref 1.4–7.7)
PLATELETS: 223 10*3/uL (ref 150.0–400.0)
RBC: 4.49 Mil/uL (ref 3.87–5.11)
RDW: 13.7 % (ref 11.5–15.5)
WBC: 5.1 10*3/uL (ref 4.0–10.5)

## 2016-09-13 LAB — COMPREHENSIVE METABOLIC PANEL
ALK PHOS: 90 U/L (ref 39–117)
ALT: 24 U/L (ref 0–35)
AST: 19 U/L (ref 0–37)
Albumin: 4 g/dL (ref 3.5–5.2)
BILIRUBIN TOTAL: 0.5 mg/dL (ref 0.2–1.2)
BUN: 9 mg/dL (ref 6–23)
CO2: 25 meq/L (ref 19–32)
Calcium: 9.3 mg/dL (ref 8.4–10.5)
Chloride: 104 mEq/L (ref 96–112)
Creatinine, Ser: 0.65 mg/dL (ref 0.40–1.20)
GFR: 138.45 mL/min (ref >60.00)
GLUCOSE: 84 mg/dL (ref 70–99)
Potassium: 3.9 mEq/L (ref 3.5–5.1)
SODIUM: 135 meq/L (ref 135–145)
TOTAL PROTEIN: 7.6 g/dL (ref 6.0–8.3)

## 2016-09-13 NOTE — Patient Instructions (Signed)

## 2016-09-13 NOTE — Progress Notes (Signed)
Pre visit review using our clinic review tool, if applicable. No additional management support is needed unless otherwise documented below in the visit note. 

## 2016-09-13 NOTE — Progress Notes (Signed)
Subjective:  Patient ID: Theresa Trevino, female    DOB: December 23, 1985  Age: 31 y.o. MRN: [id number]  CC: Abdominal Pain   HPI Theresa BonesJechelle L Iles presents for a 3 week history of intermittent right upper quadrant pain that is worsened by eating. She denies nausea, vomiting, fever, chills, loss of appetite, or weight loss. She does have a history of fatty liver disease.  Outpatient Medications Prior to Visit  Medication Sig Dispense Refill  . etonogestrel-ethinyl estradiol (NUVARING) 0.12-0.015 MG/24HR vaginal ring Place 1 each vaginally every 28 (twenty-eight) days. Insert vaginally and leave in place for 3 consecutive weeks, then remove for 1 week. 1 each 12  . metroNIDAZOLE (METROGEL) 0.75 % vaginal gel Place 1 Applicatorful vaginally 2 (two) times daily. X 5 days 70 g 0   No facility-administered medications prior to visit.     ROS Review of Systems  Constitutional: Negative for appetite change, chills, diaphoresis, fatigue and unexpected weight change.  HENT: Negative.  Negative for trouble swallowing.   Eyes: Negative.   Respiratory: Negative for cough, chest tightness, shortness of breath and wheezing.   Cardiovascular: Negative.  Negative for chest pain, palpitations and leg swelling.  Gastrointestinal: Positive for abdominal pain. Negative for constipation, diarrhea, nausea and vomiting.  Endocrine: Negative.   Genitourinary: Negative.   Musculoskeletal: Negative.  Negative for back pain and myalgias.  Skin: Negative.   Allergic/Immunologic: Negative.   Neurological: Negative.  Negative for dizziness, weakness and light-headedness.  Hematological: Negative.  Negative for adenopathy. Does not bruise/bleed easily.  Psychiatric/Behavioral: Negative.     Objective:  BP (!) 130/110 (BP Location: Right Arm, Cuff Size: Normal)   Pulse 79   Temp 98.3 F (36.8 C) (Oral)   Resp 16   Ht 5' 7.25" (1.708 m)   Wt 261 lb 1.3 oz (118.4 kg)   SpO2 98%   BMI 40.59 kg/m   BP Readings  from Last 3 Encounters:  09/13/16 (!) 130/110  06/28/16 116/72  06/14/16 110/70    Wt Readings from Last 3 Encounters:  09/13/16 261 lb 1.3 oz (118.4 kg)  06/28/16 260 lb (117.9 kg)  06/14/16 256 lb 8 oz (116.3 kg)    Physical Exam  Constitutional: She is oriented to person, place, and time.  Non-toxic appearance. She does not have a sickly appearance. She does not appear ill. No distress.  HENT:  Mouth/Throat: Oropharynx is clear and moist. No oropharyngeal exudate.  Eyes: Conjunctivae are normal. Right eye exhibits no discharge. Left eye exhibits no discharge. No scleral icterus.  Neck: Normal range of motion. Neck supple. No JVD present. No tracheal deviation present. No thyromegaly present.  Cardiovascular: Normal rate, regular rhythm, normal heart sounds and intact distal pulses.  Exam reveals no gallop and no friction rub.   No murmur heard. Pulmonary/Chest: Effort normal and breath sounds normal. No stridor. No respiratory distress. She has no wheezes. She has no rales. She exhibits no tenderness.  Abdominal: Soft. Normal appearance and bowel sounds are normal. She exhibits no distension and no mass. There is no hepatosplenomegaly, splenomegaly or hepatomegaly. There is no tenderness. There is no rigidity, no rebound, no guarding, no CVA tenderness, no tenderness at McBurney's point and negative Murphy's sign.  Musculoskeletal: Normal range of motion. She exhibits no edema, tenderness or deformity.  Lymphadenopathy:    She has no cervical adenopathy.  Neurological: She is oriented to person, place, and time.  Skin: Skin is warm and dry. No rash noted. She is not diaphoretic. No  erythema. No pallor.  Vitals reviewed.   Lab Results  Component Value Date   WBC 5.1 09/13/2016   HGB 13.4 09/13/2016   HCT 39.5 09/13/2016   PLT 223.0 09/13/2016   GLUCOSE 84 09/13/2016   CHOL 189 06/14/2016   TRIG 110.0 06/14/2016   HDL 38.30 (L) 06/14/2016   LDLCALC 129 (H) 06/14/2016   ALT  24 09/13/2016   AST 19 09/13/2016   NA 135 09/13/2016   K 3.9 09/13/2016   CL 104 09/13/2016   CREATININE 0.65 09/13/2016   BUN 9 09/13/2016   CO2 25 09/13/2016   TSH 2.07 06/14/2016   INR 1.1 (H) 06/14/2016    Dg Foot Complete Left  Result Date: 05/18/2015 CLINICAL DATA:  Left heel pain following injury of uncertain nature EXAM: LEFT FOOT - COMPLETE 3+ VIEW COMPARISON:  None. FINDINGS: Small calcaneal plantar spur is noted. No acute fracture or dislocation is seen. No gross soft tissue abnormality is noted. IMPRESSION: No acute abnormality noted. Electronically Signed   By: Alcide Clever M.D.   On: 05/18/2015 18:25    Assessment & Plan:   Tashima was seen today for abdominal pain.  Diagnoses and all orders for this visit:  Fatty liver disease, nonalcoholic- Her liver enzymes are normal so I don't think the abdominal pain is caused by fatty liver disease. -     Comprehensive metabolic panel; Future  Abdominal pain, chronic, right upper quadrant- her abdominal examination is normal, her CBC, CMP are both normal. Will order an ultrasound to see if she has cholelithiasis. -     CBC with Differential/Platelet; Future -     Comprehensive metabolic panel; Future -     US Abdomen Complete; Future   I have discontinued Ms. Hams's metroNIDAZOLE. I am also having her maintain her etonogestrel-ethinyl estradiol.  No orders of the defined types were placed in this encounter.    Follow-up: Return in about 3 weeks (around 10/04/2016).  Sanda Linger, MD

## 2016-09-26 ENCOUNTER — Other Ambulatory Visit: Payer: Self-pay | Admitting: Internal Medicine

## 2016-09-26 ENCOUNTER — Encounter: Payer: Self-pay | Admitting: Internal Medicine

## 2016-09-26 ENCOUNTER — Ambulatory Visit (HOSPITAL_COMMUNITY)
Admission: RE | Admit: 2016-09-26 | Discharge: 2016-09-26 | Disposition: A | Discharge status: Home / Self Care | Payer: 59 | Source: Ambulatory Visit | Attending: Internal Medicine | Admitting: Internal Medicine

## 2016-09-26 DIAGNOSIS — G8929 Other chronic pain: Secondary | ICD-10-CM

## 2016-09-26 DIAGNOSIS — N2889 Other specified disorders of kidney and ureter: Secondary | ICD-10-CM | POA: Insufficient documentation

## 2016-09-26 DIAGNOSIS — R1011 Right upper quadrant pain: Secondary | ICD-10-CM | POA: Insufficient documentation

## 2016-09-26 HISTORY — DX: Other specified disorders of kidney and ureter: N28.89

## 2016-09-30 ENCOUNTER — Ambulatory Visit: Payer: 59 | Admitting: Certified Nurse Midwife

## 2016-09-30 ENCOUNTER — Encounter: Payer: Self-pay | Admitting: Certified Nurse Midwife

## 2016-09-30 ENCOUNTER — Ambulatory Visit (INDEPENDENT_AMBULATORY_CARE_PROVIDER_SITE_OTHER): Payer: 59 | Admitting: Certified Nurse Midwife

## 2016-09-30 VITALS — BP 100/60 | HR 96 | Temp 98.4°F | Ht 67.0 in | Wt 261.0 lb

## 2016-09-30 DIAGNOSIS — R300 Dysuria: Secondary | ICD-10-CM

## 2016-09-30 DIAGNOSIS — B373 Candidiasis of vulva and vagina: Secondary | ICD-10-CM

## 2016-09-30 DIAGNOSIS — B3731 Acute candidiasis of vulva and vagina: Secondary | ICD-10-CM

## 2016-09-30 LAB — POCT URINALYSIS DIPSTICK
Bilirubin, UA: NEGATIVE
Blood, UA: NEGATIVE
Glucose, UA: NEGATIVE
Ketones, UA: NEGATIVE
Leukocytes, UA: NEGATIVE
Nitrite, UA: POSITIVE
Protein, UA: NEGATIVE
Urobilinogen, UA: NEGATIVE
pH, UA: 6

## 2016-09-30 MED ORDER — NYSTATIN 100000 UNIT/GM EX OINT: 1.0000 "application " | TOPICAL_OINTMENT | Freq: Two times a day (BID) | CUTANEOUS | 0 refills | Status: DC

## 2016-09-30 MED ORDER — FLUCONAZOLE 150 MG PO TABS: 150.0000 mg | ORAL_TABLET | Freq: Once | ORAL | 0 refills | Status: AC

## 2016-09-30 NOTE — Progress Notes (Signed)
31 y.o. Single African American female [id number] here with complaint of vaginal symptoms of itching, burning, and increase discharge, no odor. Describes discharge as white, and cracking areas around vulva area..Some burning when urine touches skin only. No history of HSV that she is aware. No partner change or STD concernsOnset of symptoms 5 days ago. Denies new personal products or vaginal trauma/hand or oral stimulation in area. Urinary symptoms none . Contraception is Nuvaring. 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 No other health issues today.  ROS: pertinent to HPI  O:Healthy female WDWN, obese Affect: normal, orientation x 3  Exam:Skin warm and dry CVAT: negative Abdomen:soft, non tender, negative suprapubic  Inguinal Lymph nodes: no enlargement or tenderness Pelvic exam: External genital: normal female, with small slits on external vulva area bilateral and inside labia bilateral, tender to touch,no blisters noted. HSV culture taken. Slightly red,no exudate. BUS: negative Urethra, Bladder, Urethral meatus non tender Vagina: white watery discharge noted. Ph:4.0   ,Wet prep taken, Affirm taken. Nuvaring noted in place Cervix: normal, non tender, no CMT Uterus: normal, non tender Adnexa:normal, non tender, no masses or fullness noted  Wet Prep results:Koh,Saline: + for yeast   A:Normal pelvic exam Yeast vaginitis/vulvitis R/O HSV Contraception Nuvaring   P:Discussed findings of yeast vaginitis/vulvitis and etiology. Discussed Aveeno or baking soda sitz bath for comfort. Avoid moist clothes or pads for extended period of time. If working out in gym clothes for long periods of time change underwear  if possible. Coconut Oil use for skin protection prior to activity can be used to external skin for protection or dryness. Rx: Diflucan see order with instructions Nystatin ointment see order with instructions. Obtain OTC 1% Hydrocortisone  cream and mix equal amounts with Nystatin ointment and apply bid x 7 days on external affected area.  Lab: HSV culture will treat if indicated Affirm: will treat if indicated  Rv prn

## 2016-09-30 NOTE — Patient Instructions (Signed)
Vaginal Yeast infection, Adult Vaginal yeast infection is a condition that causes soreness, swelling, and redness (inflammation) of the vagina. It also causes vaginal discharge. This is a common condition. Some women get this infection frequently. What are the causes? This condition is caused by a change in the normal balance of the yeast (candida) and bacteria that live in the vagina. This change causes an overgrowth of yeast, which causes the inflammation. What increases the risk? This condition is more likely to develop in:  Women who take antibiotic medicines.  Women who have diabetes.  Women who take birth control pills.  Women who are pregnant.  Women who douche often.  Women who have a weak defense (immune) system.  Women who have been taking steroid medicines for a long time.  Women who frequently wear tight clothing. What are the signs or symptoms? Symptoms of this condition include:  White, thick vaginal discharge.  Swelling, itching, redness, and irritation of the vagina. The lips of the vagina (vulva) may be affected as well.  Pain or a burning feeling while urinating.  Pain during sex. How is this diagnosed? This condition is diagnosed with a medical history and physical exam. This will include a pelvic exam. Your health care provider will examine a sample of your vaginal discharge under a microscope. Your health care provider may send this sample for testing to confirm the diagnosis. How is this treated? This condition is treated with medicine. Medicines may be over-the-counter or prescription. You may be told to use one or more of the following:  Medicine that is taken orally.  Medicine that is applied as a cream.  Medicine that is inserted directly into the vagina (suppository). Follow these instructions at home:  Take or apply over-the-counter and prescription medicines only as told by your health care provider.  Do not have sex until your health care  provider has approved. Tell your sex partner that you have a yeast infection. That person should go to his or her health care provider if he or she develops symptoms.  Do not wear tight clothes, such as pantyhose or tight pants.  Avoid using tampons until your health care provider approves.  Eat more yogurt. This may help to keep your yeast infection from returning.  Try taking a sitz bath to help with discomfort. This is a warm water bath that is taken while you are sitting down. The water should only come up to your hips and should cover your buttocks. Do this 3-4 times per day or as told by your health care provider.  Do not douche.  Wear breathable, cotton underwear.  If you have diabetes, keep your blood sugar levels under control. Contact a health care provider if:  You have a fever.  Your symptoms go away and then return.  Your symptoms do not get better with treatment.  Your symptoms get worse.  You have new symptoms.  You develop blisters in or around your vagina.  You have blood coming from your vagina and it is not your menstrual period.  You develop pain in your abdomen. This information is not intended to replace advice given to you by your health care provider. Make sure you discuss any questions you have with your health care provider. Document Released: 05/25/2005 Document Revised: 01/27/2016 Document Reviewed: 02/16/2015 Elsevier Interactive Patient Education  2017 [employer]   1 % hydrocortisone cream mix with Nystatin ointment apply twice daily x 7 days

## 2016-10-01 LAB — WET PREP BY MOLECULAR PROBE
CANDIDA SPECIES: NEGATIVE
GARDNERELLA VAGINALIS: NEGATIVE
TRICHOMONAS VAG: NEGATIVE

## 2016-10-01 LAB — URINALYSIS, MICROSCOPIC ONLY
Casts: NONE SEEN [LPF]
Crystals: NONE SEEN [HPF]
RBC / HPF: NONE SEEN RBC/HPF (ref <=2)
Yeast: NONE SEEN [HPF]

## 2016-10-02 LAB — URINE CULTURE

## 2016-10-02 NOTE — Progress Notes (Signed)
Encounter reviewed Jill Jertson, MD   

## 2016-10-03 LAB — HERPES SIMPLEX VIRUS CULTURE: ORGANISM ID, BACTERIA: NOT DETECTED

## 2016-10-11 ENCOUNTER — Ambulatory Visit
Admission: RE | Admit: 2016-10-11 | Discharge: 2016-10-11 | Disposition: A | Discharge status: Home / Self Care | Payer: 59 | Source: Ambulatory Visit | Attending: Internal Medicine | Admitting: Internal Medicine

## 2016-10-11 ENCOUNTER — Encounter: Payer: Self-pay | Admitting: Internal Medicine

## 2016-10-11 DIAGNOSIS — N2889 Other specified disorders of kidney and ureter: Secondary | ICD-10-CM

## 2016-10-11 DIAGNOSIS — R1011 Right upper quadrant pain: Secondary | ICD-10-CM | POA: Diagnosis not present

## 2016-10-11 MED ORDER — GADOBENATE DIMEGLUMINE 529 MG/ML IV SOLN
20.0000 mL | Freq: Once | INTRAVENOUS | Status: AC | PRN
  Administered 2016-10-11: 20 mL via INTRAVENOUS

## 2016-10-26 DIAGNOSIS — H109 Unspecified conjunctivitis: Secondary | ICD-10-CM | POA: Diagnosis not present

## 2016-12-10 ENCOUNTER — Encounter: Payer: Self-pay | Admitting: Certified Nurse Midwife

## 2016-12-11 ENCOUNTER — Telehealth: Payer: 59 | Admitting: Family

## 2016-12-11 DIAGNOSIS — R399 Unspecified symptoms and signs involving the genitourinary system: Secondary | ICD-10-CM | POA: Diagnosis not present

## 2016-12-11 MED ORDER — NITROFURANTOIN MONOHYD MACRO 100 MG PO CAPS
100.0000 mg | ORAL_CAPSULE | Freq: Two times a day (BID) | ORAL | 0 refills | Status: DC
Start: 2016-12-11 — End: 2017-07-04

## 2016-12-11 NOTE — Progress Notes (Signed)

## 2016-12-12 ENCOUNTER — Telehealth: Payer: Self-pay | Admitting: *Deleted

## 2016-12-12 NOTE — Telephone Encounter (Signed)
Left message to call Noreene Larsson at [phone].   Per review of EPIC, E-visit dated 12/11/16, patient started on Macrobid.   From Theophilus Bones To Verner Chol, CNM Sent 12/10/2016 7:20 AM  I am having discomfort when I urinate and I'm having urgency to void. I believe I have a UTI. Is there something I can be prescribed since I'm pretty sure this is a UTI?

## 2016-12-12 NOTE — Telephone Encounter (Signed)
See telephone encounter dated 12/12/16. 

## 2016-12-14 NOTE — Telephone Encounter (Signed)
Left message to call Floride Hutmacher at [phone].  

## 2016-12-16 NOTE — Telephone Encounter (Signed)
I reviewed where was seen with E visit also. Ok to close

## 2016-12-16 NOTE — Telephone Encounter (Signed)
Leota Sauers, CNM -attempted to reach patient x2, no return call. Per review of EPIC, treated for UTI via E-visit on 12/11/16, ok to close encounter?

## 2017-03-08 ENCOUNTER — Encounter: Payer: Self-pay | Admitting: Certified Nurse Midwife

## 2017-07-04 ENCOUNTER — Other Ambulatory Visit (HOSPITAL_COMMUNITY)
Admission: RE | Admit: 2017-07-04 | Discharge: 2017-07-04 | Disposition: A | Discharge status: Home / Self Care | Payer: PRIVATE HEALTH INSURANCE | Source: Ambulatory Visit | Attending: Certified Nurse Midwife | Admitting: Certified Nurse Midwife

## 2017-07-04 ENCOUNTER — Encounter: Payer: Self-pay | Admitting: Certified Nurse Midwife

## 2017-07-04 ENCOUNTER — Ambulatory Visit (INDEPENDENT_AMBULATORY_CARE_PROVIDER_SITE_OTHER): Payer: PRIVATE HEALTH INSURANCE | Admitting: Certified Nurse Midwife

## 2017-07-04 VITALS — BP 110/72 | HR 68 | Resp 16 | Ht 67.25 in | Wt 257.0 lb

## 2017-07-04 DIAGNOSIS — Z113 Encounter for screening for infections with a predominantly sexual mode of transmission: Secondary | ICD-10-CM

## 2017-07-04 DIAGNOSIS — Z124 Encounter for screening for malignant neoplasm of cervix: Secondary | ICD-10-CM | POA: Diagnosis not present

## 2017-07-04 DIAGNOSIS — E041 Nontoxic single thyroid nodule: Secondary | ICD-10-CM | POA: Insufficient documentation

## 2017-07-04 DIAGNOSIS — Z0000 Encounter for general adult medical examination without abnormal findings: Secondary | ICD-10-CM | POA: Diagnosis not present

## 2017-07-04 DIAGNOSIS — Z01419 Encounter for gynecological examination (general) (routine) without abnormal findings: Secondary | ICD-10-CM

## 2017-07-04 LAB — HM PAP SMEAR

## 2017-07-04 NOTE — Patient Instructions (Signed)
General topics  Next pap or exam is  due in 1 year Take a Women's multivitamin Take 1200 mg. of calcium daily - prefer dietary If any concerns in interim to call back  Breast Self-Awareness Practicing breast self-awareness may pick up problems early, prevent significant medical complications, and possibly save your life. By practicing breast self-awareness, you can become familiar with how your breasts look and feel and if your breasts are changing. This allows you to notice changes early. It can also offer you some reassurance that your breast health is good. One way to learn what is normal for your breasts and whether your breasts are changing is to do a breast self-exam. If you find a lump or something that was not present in the past, it is best to contact your caregiver right away. Other findings that should be evaluated by your caregiver include nipple discharge, especially if it is bloody; skin changes or reddening; areas where the skin seems to be pulled in (retracted); or new lumps and bumps. Breast pain is seldom associated with cancer (malignancy), but should also be evaluated by a caregiver. BREAST SELF-EXAM The best time to examine your breasts is 5 7 days after your menstrual period is over.  ExitCare Patient Information 2013 [employer].   Exercise to Stay Healthy Exercise helps you become and stay healthy. EXERCISE IDEAS AND TIPS Choose exercises that:  You enjoy.  Fit into your day. You do not need to exercise really hard to be healthy. You can do exercises at a slow or medium level and stay healthy. You can:  Stretch before and after working out.  Try yoga, Pilates, or tai chi.  Lift weights.  Walk fast, swim, jog, run, climb stairs, bicycle, dance, or rollerskate.  Take aerobic classes. Exercises that burn about 150 calories:  Running 1  miles in 15 minutes.  Playing volleyball for 45 to 60 minutes.  Washing and waxing a car for 45 to 60  minutes.  Playing touch football for 45 minutes.  Walking 1  miles in 35 minutes.  Pushing a stroller 1  miles in 30 minutes.  Playing basketball for 30 minutes.  Raking leaves for 30 minutes.  Bicycling 5 miles in 30 minutes.  Walking 2 miles in 30 minutes.  Dancing for 30 minutes.  Shoveling snow for 15 minutes.  Swimming laps for 20 minutes.  Walking up stairs for 15 minutes.  Bicycling 4 miles in 15 minutes.  Gardening for 30 to 45 minutes.  Jumping rope for 15 minutes.  Washing windows or floors for 45 to 60 minutes. Document Released: 09/17/2010 Document Revised: 11/07/2011 Document Reviewed: 09/17/2010 ExitCare Patient Information 2013 [employer].   Other topics ( that may be useful information):    Sexually Transmitted Disease Sexually transmitted disease (STD) refers to any infection that is passed from person to person during sexual activity. This may happen by way of saliva, semen, blood, vaginal mucus, or urine. Common STDs include:  Gonorrhea.  Chlamydia.  Syphilis.  HIV/AIDS.  Genital herpes.  Hepatitis B and C.  Trichomonas.  Human papillomavirus (HPV).  Pubic lice. CAUSES  An STD may be spread by bacteria, virus, or parasite. A person can get an STD by:  Sexual intercourse with an infected person.  Sharing sex toys with an infected person.  Sharing needles with an infected person.  Having intimate contact with the genitals, mouth, or rectal areas of an infected person. SYMPTOMS  Some people may not have any symptoms, but   they can still pass the infection to others. Different STDs have different symptoms. Symptoms include:  Painful or bloody urination.  Pain in the pelvis, abdomen, vagina, anus, throat, or eyes.  Skin rash, itching, irritation, growths, or sores (lesions). These usually occur in the genital or anal area.  Abnormal vaginal discharge.  Penile discharge in men.  Soft, flesh-colored skin growths in the  genital or anal area.  Fever.  Pain or bleeding during sexual intercourse.  Swollen glands in the groin area.  Yellow skin and eyes (jaundice). This is seen with hepatitis. DIAGNOSIS  To make a diagnosis, your caregiver may:  Take a medical history.  Perform a physical exam.  Take a specimen (culture) to be examined.  Examine a sample of discharge under a microscope.  Perform blood test TREATMENT   Chlamydia, gonorrhea, trichomonas, and syphilis can be cured with antibiotic medicine.  Genital herpes, hepatitis, and HIV can be treated, but not cured, with prescribed medicines. The medicines will lessen the symptoms.  Genital warts from HPV can be treated with medicine or by freezing, burning (electrocautery), or surgery. Warts may come back.  HPV is a virus and cannot be cured with medicine or surgery.However, abnormal areas may be followed very closely by your caregiver and may be removed from the cervix, vagina, or vulva through office procedures or surgery. If your diagnosis is confirmed, your recent sexual partners need treatment. This is true even if they are symptom-free or have a negative culture or evaluation. They should not have sex until their caregiver says it is okay. HOME CARE INSTRUCTIONS  All sexual partners should be informed, tested, and treated for all STDs.  Take your antibiotics as directed. Finish them even if you start to feel better.  Only take over-the-counter or prescription medicines for pain, discomfort, or fever as directed by your caregiver.  Rest.  Eat a balanced diet and drink enough fluids to keep your urine clear or pale yellow.  Do not have sex until treatment is completed and you have followed up with your caregiver. STDs should be checked after treatment.  Keep all follow-up appointments, Pap tests, and blood tests as directed by your caregiver.  Only use latex condoms and water-soluble lubricants during sexual activity. Do not use  petroleum jelly or oils.  Avoid alcohol and illegal drugs.  Get vaccinated for HPV and hepatitis. If you have not received these vaccines in the past, talk to your caregiver about whether one or both might be right for you.  Avoid risky sex practices that can break the skin. The only way to avoid getting an STD is to avoid all sexual activity.Latex condoms and dental dams (for oral sex) will help lessen the risk of getting an STD, but will not completely eliminate the risk. SEEK MEDICAL CARE IF:   You have a fever.  You have any new or worsening symptoms. Document Released: 11/05/2002 Document Revised: 11/07/2011 Document Reviewed: 11/12/2010 [facility] Patient Information 2013 [employer].    Domestic Abuse You are being battered or abused if someone close to you hits, pushes, or physically hurts you in any way. You also are being abused if you are forced into activities. You are being sexually abused if you are forced to have sexual contact of any kind. You are being emotionally abused if you are made to feel worthless or if you are constantly threatened. It is important to remember that help is available. No one has the right to abuse you. PREVENTION OF FURTHER  ABUSE  Learn the warning signs of danger. This varies with situations but may include: the use of alcohol, threats, isolation from friends and family, or forced sexual contact. Leave if you feel that violence is going to occur.  If you are attacked or beaten, report it to the police so the abuse is documented. You do not have to press charges. The police can protect you while you or the attackers are leaving. Get the officer's name and badge number and a copy of the report.  Find someone you can trust and tell them what is happening to you: your caregiver, a nurse, clergy member, close friend or family member. Feeling ashamed is natural, but remember that you have done nothing wrong. No one deserves abuse. Document Released:  08/12/2000 Document Revised: 11/07/2011 Document Reviewed: 10/21/2010 ExitCare Patient Information 2013 [employer].    How Much is Too Much Alcohol? Drinking too much alcohol can cause injury, accidents, and health problems. These types of problems can include:   Car crashes.  Falls.  Family fighting (domestic violence).  Drowning.  Fights.  Injuries.  Burns.  Damage to certain organs.  Having a baby with birth defects. ONE DRINK CAN BE TOO MUCH WHEN YOU ARE:  Working.  Pregnant or breastfeeding.  Taking medicines. Ask your doctor.  Driving or planning to drive. If you or someone you know has a drinking problem, get help from a doctor.  Document Released: 06/11/2009 Document Revised: 11/07/2011 Document Reviewed: 06/11/2009 ExitCare Patient Information 2013 [employer].   Smoking Hazards Smoking cigarettes is extremely bad for your health. Tobacco smoke has over 200 known poisons in it. There are over 60 chemicals in tobacco smoke that cause cancer. Some of the chemicals found in cigarette smoke include:   Cyanide.  Benzene.  Formaldehyde.  Methanol (wood alcohol).  Acetylene (fuel used in welding torches).  Ammonia. Cigarette smoke also contains the poisonous gases nitrogen oxide and carbon monoxide.  Cigarette smokers have an increased risk of many serious medical problems and Smoking causes approximately:  90% of all lung cancer deaths in men.  80% of all lung cancer deaths in women.  90% of deaths from chronic obstructive lung disease. Compared with nonsmokers, smoking increases the risk of:  Coronary heart disease by 2 to 4 times.  Stroke by 2 to 4 times.  Men developing lung cancer by 23 times.  Women developing lung cancer by 13 times.  Dying from chronic obstructive lung diseases by 12 times.  . Smoking is the most preventable cause of death and disease in our society.  WHY IS SMOKING ADDICTIVE?  Nicotine is the chemical  agent in tobacco that is capable of causing addiction or dependence.  When you smoke and inhale, nicotine is absorbed rapidly into the bloodstream through your lungs. Nicotine absorbed through the lungs is capable of creating a powerful addiction. Both inhaled and non-inhaled nicotine may be addictive.  Addiction studies of cigarettes and spit tobacco show that addiction to nicotine occurs mainly during the teen years, when young people begin using tobacco products. WHAT ARE THE BENEFITS OF QUITTING?  There are many health benefits to quitting smoking.   Likelihood of developing cancer and heart disease decreases. Health improvements are seen almost immediately.  Blood pressure, pulse rate, and breathing patterns start returning to normal soon after quitting. QUITTING SMOKING   American Lung Association - 1-800-LUNGUSA  American Cancer Society - 1-800-ACS-2345 Document Released: 09/22/2004 Document Revised: 11/07/2011 Document Reviewed: 05/27/2009 ExitCare Patient Information 2013 ExitCare,   LLC.   Stress Management Stress is a state of physical or mental tension that often results from changes in your life or normal routine. Some common causes of stress are:  Death of a loved one.  Injuries or severe illnesses.  Getting fired or changing jobs.  Moving into a new home. Other causes may be:  Sexual problems.  Business or financial losses.  Taking on a large debt.  Regular conflict with someone at home or at work.  Constant tiredness from lack of sleep. It is not just bad things that are stressful. It may be stressful to:  Win the lottery.  Get married.  Buy a new car. The amount of stress that can be easily tolerated varies from person to person. Changes generally cause stress, regardless of the types of change. Too much stress can affect your health. It may lead to physical or emotional problems. Too little stress (boredom) may also become stressful. SUGGESTIONS TO  REDUCE STRESS:  Talk things over with your family and friends. It often is helpful to share your concerns and worries. If you feel your problem is serious, you may want to get help from a professional counselor.  Consider your problems one at a time instead of lumping them all together. Trying to take care of everything at once may seem impossible. List all the things you need to do and then start with the most important one. Set a goal to accomplish 2 or 3 things each day. If you expect to do too many in a single day you will naturally fail, causing you to feel even more stressed.  Do not use alcohol or drugs to relieve stress. Although you may feel better for a short time, they do not remove the problems that caused the stress. They can also be habit forming.  Exercise regularly - at least 3 times per week. Physical exercise can help to relieve that "uptight" feeling and will relax you.  The shortest distance between despair and hope is often a good night's sleep.  Go to bed and get up on time allowing yourself time for appointments without being rushed.  Take a short "time-out" period from any stressful situation that occurs during the day. Close your eyes and take some deep breaths. Starting with the muscles in your face, tense them, hold it for a few seconds, then relax. Repeat this with the muscles in your neck, shoulders, hand, stomach, back and legs.  Take good care of yourself. Eat a balanced diet and get plenty of rest.  Schedule time for having fun. Take a break from your daily routine to relax. HOME CARE INSTRUCTIONS   Call if you feel overwhelmed by your problems and feel you can no longer manage them on your own.  Return immediately if you feel like hurting yourself or someone else. Document Released: 02/08/2001 Document Revised: 11/07/2011 Document Reviewed: 10/01/2007 ExitCare Patient Information 2013 ExitCare[employer].   

## 2017-07-04 NOTE — Progress Notes (Signed)
31 y.o. G0P0000 Single  African Ame33rican Fe here for annual exam. Periods normal, no issues. Sexually active not using contraception. Using Diva Cup use with less bleeding,same duration with this cycle. Did not do UPT. No symptoms of pregnancy.Sexually active with same partner, no STD concerns, but would like screening.  Has history of fatty liver disease. Not concerned if becomes pregnant would be welcome. Sees Dr. Yetta BarreJones PCP/yearly. No other health issues today. Now working night shift and adjusting.   Patient's last menstrual period was 06/28/2017 (exact date).          Sexually active: Yes.    The current method of family planning is none.    Exercising: No.  exercise Smoker:  no  Health Maintenance: Pap:  7/14 neg, 06-28-16 neg History of Abnormal Pap: no MMG:  none Self Breast exams: yes Colonoscopy:  none BMD:   none TDaP:  2009 Shingles: no Pneumonia: no Hep C and HIV: both neg 2017 Labs: yes  POCT UPT negative   reports that she has quit smoking. she has never used smokeless tobacco. She reports that she drinks alcohol. She reports that she does not use drugs.  Past Medical History:  Diagnosis Date  . History of chicken pox   . Hx: UTI (urinary tract infection)   . 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 STD (sexually transmitted disease)    trichomonas    History reviewed. No pertinent surgical history.  No current outpatient medications on file.   No current facility-administered medications for this visit.     Family History  Problem Relation Age of Onset  . Sarcoidosis Maternal Grandmother   . Colon cancer Maternal Grandfather     ROS:  Pertinent items are noted in HPI.  Otherwise, a comprehensive ROS was negative.  Exam:   BP 110/72   Pulse 68   Resp 16   Ht 5' 7.25" (1.708 m)   Wt 257 lb (116.6 kg)   LMP 06/28/2017 (Exact Date)   BMI 39.95 kg/m  Height: 5' 7.25" (170.8 cm) Ht Readings from Last 3 Encounters:  07/04/17 5' 7.25" (1.708 m)  09/30/16 5\' 7"   (1.702 m)  09/13/16 5\' 7"  (1.702 m)    General appearance: alert, cooperative and appears stated age Head: Normocephalic, without obvious abnormality, atraumatic Neck: no adenopathy, supple, symmetrical, trachea midline and thyroid nodular and enlargement bilateral Lungs: clear to auscultation bilaterally Breasts: normal appearance, no masses or tenderness, No nipple retraction or dimpling, No nipple discharge or bleeding, No axillary or supraclavicular adenopathy Heart: regular rate and rhythm Abdomen: soft, non-tender; no masses,  no organomegaly Extremities: extremities normal, atraumatic, no cyanosis or edema Skin: Skin color, texture, turgor normal. No rashes or lesions Lymph nodes: Cervical, supraclavicular, and axillary nodes normal. No abnormal inguinal nodes palpated Neurologic: Grossly normal   Pelvic: External genitalia:  no lesions              Urethra:  normal appearing urethra with no masses, tenderness or lesions              Bartholin's and Skene's: normal                 Vagina: normal appearing vagina with normal color and discharge, no lesions              Cervix: no cervical motion tenderness, no lesions and nulliparous appearance              Pap taken: Yes.   Bimanual Exam:  Uterus:  normal size, contour, position, consistency, mobility, non-tender              Adnexa: normal adnexa and no mass, fullness, tenderness               Rectovaginal: Confirms               Anus:  normal sphincter tone, no lesions  Chaperone present: yes  A:  Well Woman with normal exam  Contraception none  Nodular thyroid feel with enlargement noted  Morbid obesity  Screening labs/STD  History of fatty liver  Family history of colon cancer mgf  P:   Reviewed health and wellness pertinent to exam  Discussed importance of contraception if not planning for pregnancy, condom use for STD protection also discussed  Discussed finding and patient thought she might have some change, but  feeling the thyroid. Discussed need for evaluation with lab TSH and with US. Patient agreeable. Patient will be called with US information. Questions addressed regarding finding.  Lab TSH  Discussed importance of healthy weight for improved health and prevention of other issues.  Labs: GC/Chlamydia, Vaginal Screen, HIV, RPR, Hep C, lipid panel  Discussed importance of follow up with PCP as indicated  Pap smear: yes   counseled on breast self exam, STD prevention, HIV risk factors and prevention, adequate intake of calcium and vitamin D, diet and exercise  return annually or prn  An After Visit Summary was printed and given to the patient.

## 2017-07-05 ENCOUNTER — Other Ambulatory Visit: Payer: Self-pay | Admitting: Certified Nurse Midwife

## 2017-07-05 DIAGNOSIS — R899 Unspecified abnormal finding in specimens from other organs, systems and tissues: Secondary | ICD-10-CM

## 2017-07-05 DIAGNOSIS — E041 Nontoxic single thyroid nodule: Secondary | ICD-10-CM

## 2017-07-05 LAB — LIPID PANEL
CHOL/HDL RATIO: 4.9 ratio — AB (ref 0.0–4.4)
CHOLESTEROL TOTAL: 178 mg/dL (ref 100–199)
HDL: 36 mg/dL — ABNORMAL LOW (ref >39)
LDL Calculated: 108 mg/dL — ABNORMAL HIGH (ref 0–99)
Triglycerides: 171 mg/dL — ABNORMAL HIGH (ref 0–149)
VLDL CHOLESTEROL CAL: 34 mg/dL (ref 5–40)

## 2017-07-05 LAB — VAGINITIS/VAGINOSIS, DNA PROBE
CANDIDA SPECIES: NEGATIVE
Gardnerella vaginalis: NEGATIVE
Trichomonas vaginosis: NEGATIVE

## 2017-07-05 LAB — HEP, RPR, HIV PANEL
HEP B S AG: NEGATIVE
HIV SCREEN 4TH GENERATION: NONREACTIVE
RPR: NONREACTIVE

## 2017-07-05 LAB — GC/CHLAMYDIA PROBE AMP
Chlamydia trachomatis, NAA: NEGATIVE
Neisseria gonorrhoeae by PCR: NEGATIVE

## 2017-07-05 LAB — TSH: TSH: 2.35 u[IU]/mL (ref 0.450–4.500)

## 2017-07-06 LAB — CYTOLOGY - PAP
Diagnosis: NEGATIVE
HPV (WINDOPATH): NOT DETECTED

## 2017-07-16 ENCOUNTER — Telehealth: Payer: PRIVATE HEALTH INSURANCE | Admitting: Physician Assistant

## 2017-07-16 DIAGNOSIS — J019 Acute sinusitis, unspecified: Secondary | ICD-10-CM

## 2017-07-16 DIAGNOSIS — B9789 Other viral agents as the cause of diseases classified elsewhere: Secondary | ICD-10-CM

## 2017-07-16 MED ORDER — FLUTICASONE PROPIONATE 50 MCG/ACT NA SUSP: 2.0000 | Freq: Every day | NASAL | 0 refills | Status: DC

## 2017-07-16 NOTE — Progress Notes (Signed)

## 2017-08-12 ENCOUNTER — Other Ambulatory Visit: Payer: Self-pay | Admitting: Physician Assistant

## 2017-08-12 DIAGNOSIS — B9789 Other viral agents as the cause of diseases classified elsewhere: Secondary | ICD-10-CM

## 2017-08-12 DIAGNOSIS — J019 Acute sinusitis, unspecified: Principal | ICD-10-CM

## 2017-08-17 ENCOUNTER — Telehealth: Payer: Self-pay | Admitting: Certified Nurse Midwife

## 2017-08-17 NOTE — Telephone Encounter (Signed)
Order placed on behalf of this patient for [facility] thyroid [facility]. [facility] contacted patient on 07/06/17 and 07/14/17, with no return calls.  I contacted to patient on 07/18/17 and spoke with her regarding the referral for the thyroid ultrasound.  Patient states she did not receive any messages from [facility] for scheduling, at that time I provided patient with the telephone number for [facility] [phone]. Patient states she will call their office to schedule the thyroid ultrasound.    To date the recommended ultrasound has not been scheduled.  I attempted to call patient to follow up today, but there was no answer and the phone voicemail is full.    Routing to provider, Leota Sauerseborah Leonard, CNM,  to review and advise

## 2017-08-18 NOTE — Telephone Encounter (Signed)
I feel she has been adequately contacted regarding need for US. I think we can close encounter

## 2017-09-11 ENCOUNTER — Telehealth: Payer: Self-pay | Admitting: Certified Nurse Midwife

## 2017-09-11 ENCOUNTER — Ambulatory Visit: Payer: PRIVATE HEALTH INSURANCE | Admitting: Obstetrics and Gynecology

## 2017-09-11 NOTE — Telephone Encounter (Signed)
Spoke with patient. Patient states that her menses is 10 days late. Took a pregnancy test 1 hour ago that was negative. Not on any form of birth control. States she is having left throbbing pelvic pain that is intermittent. Denies any sharp pain, fever or chills. States her menses has been late before but never this late. Advised will need to be seen for further evaluation. Appointment scheduled for today at 2 pm with Dr.Jertson. Patient is agreeable to date and time.  Routing to covering provider for final review. Patient agreeable to disposition. Will close encounter.

## 2017-09-11 NOTE — Telephone Encounter (Signed)
Patient overslept and missed her 2:00 pm appointment.  Offered patient several other appointmetns for tomorrow and she declined.  Patient requested to see Leota SauersDeborah Leonard on Friday, [facility] 09/15/17 @4 :00

## 2017-09-11 NOTE — Telephone Encounter (Signed)
Patient missed her appointment, made a f/u appointment later this week with Ms Darcel BayleyLeonard

## 2017-09-11 NOTE — Telephone Encounter (Signed)
Patient requested appointment through MyChart.  Called patient to schedule and patient states she is 10 days late for her cycle and is experiencing left sided pelvic pain and back pain. Would like to speak to a nurse before scheduling.

## 2017-09-12 ENCOUNTER — Other Ambulatory Visit (INDEPENDENT_AMBULATORY_CARE_PROVIDER_SITE_OTHER): Payer: PRIVATE HEALTH INSURANCE

## 2017-09-12 ENCOUNTER — Encounter: Payer: Self-pay | Admitting: Internal Medicine

## 2017-09-12 ENCOUNTER — Ambulatory Visit: Payer: PRIVATE HEALTH INSURANCE | Admitting: Internal Medicine

## 2017-09-12 VITALS — BP 120/80 | HR 90 | Temp 98.7°F | Resp 16 | Ht 67.25 in | Wt 255.2 lb

## 2017-09-12 DIAGNOSIS — R202 Paresthesia of skin: Secondary | ICD-10-CM | POA: Diagnosis not present

## 2017-09-12 DIAGNOSIS — N912 Amenorrhea, unspecified: Secondary | ICD-10-CM

## 2017-09-12 DIAGNOSIS — R200 Anesthesia of skin: Secondary | ICD-10-CM

## 2017-09-12 DIAGNOSIS — M5416 Radiculopathy, lumbar region: Secondary | ICD-10-CM

## 2017-09-12 LAB — HCG, QUANTITATIVE, PREGNANCY: Quantitative HCG: 0.12 m[IU]/mL

## 2017-09-12 NOTE — Patient Instructions (Signed)
Sciatica Sciatica is pain, numbness, weakness, or tingling along the path of the sciatic nerve. The sciatic nerve starts in the lower back and runs down the back of each leg. The nerve controls the muscles in the lower leg and in the back of the knee. It also provides feeling (sensation) to the back of the thigh, the lower leg, and the sole of the foot. Sciatica is a symptom of another medical condition that pinches or puts pressure on the sciatic nerve. Generally, sciatica only affects one side of the body. Sciatica usually goes away on its own or with treatment. In some cases, sciatica may keep coming back (recur). What are the causes? This condition is caused by pressure on the sciatic nerve, or pinching of the sciatic nerve. This may be the result of:  A disk in between the bones of the spine (vertebrae) bulging out too far (herniated disk).  Age-related changes in the spinal disks (degenerative disk disease).  A pain disorder that affects a muscle in the buttock (piriformis syndrome).  Extra bone growth (bone spur) near the sciatic nerve.  An injury or break (fracture) of the pelvis.  Pregnancy.  Tumor (rare).  What increases the risk? The following factors may make you more likely to develop this condition:  Playing sports that place pressure or stress on the spine, such as football or weight lifting.  Having poor strength and flexibility.  A history of back injury.  A history of back surgery.  Sitting for long periods of time.  Doing activities that involve repetitive bending or lifting.  Obesity.  What are the signs or symptoms? Symptoms can vary from mild to very severe, and they may include:  Any of these problems in the lower back, leg, hip, or buttock: ? Mild tingling or dull aches. ? Burning sensations. ? Sharp pains.  Numbness in the back of the calf or the sole of the foot.  Leg weakness.  Severe back pain that makes movement difficult.  These  symptoms may get worse when you cough, sneeze, or laugh, or when you sit or stand for long periods of time. Being overweight may also make symptoms worse. In some cases, symptoms may recur over time. How is this diagnosed? This condition may be diagnosed based on:  Your symptoms.  A physical exam. Your health care provider may ask you to do certain movements to check whether those movements trigger your symptoms.  You may have tests, including: ? Blood tests. ? X-rays. ? MRI. ? CT scan.  How is this treated? In many cases, this condition improves on its own, without any treatment. However, treatment may include:  Reducing or modifying physical activity during periods of pain.  Exercising and stretching to strengthen your abdomen and improve the flexibility of your spine.  Icing and applying heat to the affected area.  Medicines that help: ? To relieve pain and swelling. ? To relax your muscles.  Injections of medicines that help to relieve pain, irritation, and inflammation around the sciatic nerve (steroids).  Surgery.  Follow these instructions at home: Medicines  Take over-the-counter and prescription medicines only as told by your health care provider.  Do not drive or operate heavy machinery while taking prescription pain medicine. Managing pain  If directed, apply ice to the affected area. ? Put ice in a plastic bag. ? Place a towel between your skin and the bag. ? Leave the ice on for 20 minutes, 2-3 times a day.  After icing, apply   heat to the affected area before you exercise or as often as told by your health care provider. Use the heat source that your health care provider recommends, such as a moist heat pack or a heating pad. ? Place a towel between your skin and the heat source. ? Leave the heat on for 20-30 minutes. ? Remove the heat if your skin turns bright red. This is especially important if you are unable to feel pain, heat, or cold. You may have a  greater risk of getting burned. Activity  Return to your normal activities as told by your health care provider. Ask your health care provider what activities are safe for you. ? Avoid activities that make your symptoms worse.  Take brief periods of rest throughout the day. Resting in a lying or standing position is usually better than sitting to rest. ? When you rest for longer periods, mix in some mild activity or stretching between periods of rest. This will help to prevent stiffness and pain. ? Avoid sitting for long periods of time without moving. Get up and move around at least one time each hour.  Exercise and stretch regularly, as told by your health care provider.  Do not lift anything that is heavier than 10 lb (4.5 kg) while you have symptoms of sciatica. When you do not have symptoms, you should still avoid heavy lifting, especially repetitive heavy lifting.  When you lift objects, always use proper lifting technique, which includes: ? Bending your knees. ? Keeping the load close to your body. ? Avoiding twisting. General instructions  Use good posture. ? Avoid leaning forward while sitting. ? Avoid hunching over while standing.  Maintain a healthy weight. Excess weight puts extra stress on your back and makes it difficult to maintain good posture.  Wear supportive, comfortable shoes. Avoid wearing high heels.  Avoid sleeping on a mattress that is too soft or too hard. A mattress that is firm enough to support your back when you sleep may help to reduce your pain.  Keep all follow-up visits as told by your health care provider. This is important. Contact a health care provider if:  You have pain that wakes you up when you are sleeping.  You have pain that gets worse when you lie down.  Your pain is worse than you have experienced in the past.  Your pain lasts longer than 4 weeks.  You experience unexplained weight loss. Get help right away if:  You lose control  of your bowel or bladder (incontinence).  You have: ? Weakness in your lower back, pelvis, buttocks, or legs that gets worse. ? Redness or swelling of your back. ? A burning sensation when you urinate. This information is not intended to replace advice given to you by your health care provider. Make sure you discuss any questions you have with your health care provider. Document Released: 08/09/2001 Document Revised: 01/19/2016 Document Reviewed: 04/24/2015 Elsevier Interactive Patient Education  2018 [employer]  

## 2017-09-12 NOTE — Progress Notes (Signed)
Subjective:  Patient ID: Theresa Trevino, female    DOB: 02-Jan-1986  Age: 32 y.o. MRN: [id number]  CC: Back Pain   HPI Theresa Trevino presents for concerns about a 3267-month history of intermittent but recently worsening left low back pain that radiates into the left buttock.  She also complains of numbness and tingling in her left thigh with intermittent episodes of weakness in her left lower extremity.  She is taking Tylenol for symptom relief.  She is also concerned that she is 10-day is late for her menses.  She is not currently on a contraceptive and admits to risk of pregnancy.  She tells me for the last year she has had irregular menstrual cycles.  Outpatient Medications Prior to Visit  Medication Sig Dispense Refill  . fluticasone (FLONASE) 50 MCG/ACT nasal spray Place 2 sprays daily into both nostrils. 16 g 0   No facility-administered medications prior to visit.     ROS Review of Systems  Constitutional: Negative for appetite change, chills, diaphoresis, fatigue and fever.  HENT: Negative.  Negative for sore throat.   Eyes: Negative.  Negative for visual disturbance.  Respiratory: Negative.  Negative for cough, chest tightness, shortness of breath and wheezing.   Cardiovascular: Negative for chest pain, palpitations and leg swelling.  Gastrointestinal: Negative for abdominal pain, constipation, diarrhea, nausea and vomiting.  Endocrine: Negative.   Genitourinary: Negative.  Negative for difficulty urinating.  Musculoskeletal: Positive for back pain. Negative for myalgias.  Skin: Negative.  Negative for color change and rash.  Allergic/Immunologic: Negative.   Neurological: Positive for weakness and numbness. Negative for dizziness, light-headedness and headaches.  Hematological: Negative for adenopathy. Does not bruise/bleed easily.  Psychiatric/Behavioral: Negative.     Objective:  BP 120/80 (BP Location: Left Arm, Patient Position: Sitting, Cuff Size: Large)    Pulse 90   Temp 98.7 F (37.1 C) (Oral)   Resp 16   Ht 5' 7.25" (1.708 m)   Wt 255 lb 4 oz (115.8 kg)   LMP 08/02/2017 (Exact Date)   SpO2 99%   BMI 39.68 kg/m   BP Readings from Last 3 Encounters:  09/12/17 120/80  07/04/17 110/72  09/30/16 100/60    Wt Readings from Last 3 Encounters:  09/12/17 255 lb 4 oz (115.8 kg)  07/04/17 257 lb (116.6 kg)  09/30/16 261 lb (118.4 kg)    Physical Exam  Constitutional: She is oriented to person, place, and time. No distress.  HENT:  Mouth/Throat: Oropharynx is clear and moist. No oropharyngeal exudate.  Eyes: Conjunctivae are normal. Left eye exhibits no discharge. No scleral icterus.  Neck: Normal range of motion. Neck supple. No JVD present. No thyromegaly present.  Cardiovascular: Normal rate, regular rhythm and normal heart sounds.  No murmur heard. Pulmonary/Chest: Effort normal and breath sounds normal. No respiratory distress. She has no wheezes. She has no rales.  Abdominal: Soft. She exhibits no mass. There is no tenderness.  Musculoskeletal: Normal range of motion. She exhibits no edema, tenderness or deformity.       Lumbar back: Normal. She exhibits normal range of motion, no tenderness, no bony tenderness, no swelling, no edema, no deformity, no pain and no spasm.  Lymphadenopathy:    She has no cervical adenopathy.  Neurological: She is alert and oriented to person, place, and time. She has normal reflexes. She displays no atrophy, no tremor and normal reflexes. A sensory deficit is present. No cranial nerve deficit. She exhibits normal muscle tone. She displays a  negative Romberg sign. Coordination and gait normal.  There is decreased sensation in the left thigh..  + SLR over LLE - SLR over RLE  Skin: Skin is warm and dry. No rash noted. She is not diaphoretic. No erythema. No pallor.  Vitals reviewed.   Lab Results  Component Value Date   WBC 5.1 09/13/2016   HGB 13.4 09/13/2016   HCT 39.5 09/13/2016   PLT 223.0  09/13/2016   GLUCOSE 84 09/13/2016   CHOL 178 07/04/2017   TRIG 171 (H) 07/04/2017   HDL 36 (L) 07/04/2017   LDLCALC 108 (H) 07/04/2017   ALT 24 09/13/2016   AST 19 09/13/2016   NA 135 09/13/2016   K 3.9 09/13/2016   CL 104 09/13/2016   CREATININE 0.65 09/13/2016   BUN 9 09/13/2016   CO2 25 09/13/2016   TSH 2.350 07/04/2017   INR 1.1 (H) 06/14/2016    No results found.  Assessment & Plan:   Theresa Trevino was seen today for back pain.  Diagnoses and all orders for this visit:  Amenorrhea- Her beta hCG is negative.  She is not pregnant.  GYN about the irregular menses. -     hCG, quantitative, pregnancy; Future  Numbness and tingling of left leg- I am concerned this may be meralgia paresthetica.  I have asked her undergo NCS/EMG to identify what is causing the symptoms. -     Ambulatory referral to Neurology -     MR Lumbar Spine Wo Contrast; Future  Left lumbar radiculitis- She has a 62-month history of radiating low back pain with neurological symptoms in the left lower extremity and an abnormal neuro exam.  I have asked her to undergo an MRI of her lumbar spine to see if there is a disc herniation, spinal stenosis, tumor, or mass. -     MR Lumbar Spine Wo Contrast; Future   I have discontinued Theresa Trevino's fluticasone.  No orders of the defined types were placed in this encounter.    Follow-up: Return in about 4 weeks (around 10/10/2017).  Sanda Linger, MD

## 2017-09-13 ENCOUNTER — Other Ambulatory Visit: Payer: Self-pay | Admitting: *Deleted

## 2017-09-13 ENCOUNTER — Encounter: Payer: Self-pay | Admitting: Neurology

## 2017-09-13 DIAGNOSIS — R200 Anesthesia of skin: Secondary | ICD-10-CM

## 2017-09-13 DIAGNOSIS — R202 Paresthesia of skin: Secondary | ICD-10-CM

## 2017-09-13 NOTE — Progress Notes (Unsigned)
emg 

## 2017-09-15 ENCOUNTER — Ambulatory Visit
Admission: RE | Admit: 2017-09-15 | Discharge: 2017-09-15 | Disposition: A | Discharge status: Home / Self Care | Payer: PRIVATE HEALTH INSURANCE | Source: Ambulatory Visit | Attending: Certified Nurse Midwife | Admitting: Certified Nurse Midwife

## 2017-09-15 DIAGNOSIS — E041 Nontoxic single thyroid nodule: Secondary | ICD-10-CM

## 2017-09-18 ENCOUNTER — Telehealth: Payer: Self-pay

## 2017-09-18 NOTE — Telephone Encounter (Signed)
-----   Message from Verner Choleborah S Leonard, CNM sent at 09/17/2017 10:58 PM EST ----- Notify patient that no discrete nodules were noted, The gland is mildly heterogeneous without a focal nodule. No further screening needed. Will continue to follow at aex.

## 2017-09-18 NOTE — Telephone Encounter (Signed)
Contacted patient at number provided.There was no answer and recording states that the voicemail box is full.

## 2017-09-22 ENCOUNTER — Encounter: Payer: Self-pay | Admitting: Certified Nurse Midwife

## 2017-09-22 ENCOUNTER — Ambulatory Visit (INDEPENDENT_AMBULATORY_CARE_PROVIDER_SITE_OTHER): Payer: PRIVATE HEALTH INSURANCE | Admitting: Certified Nurse Midwife

## 2017-09-22 ENCOUNTER — Other Ambulatory Visit: Payer: Self-pay

## 2017-09-22 VITALS — BP 120/78 | HR 70 | Resp 16 | Ht 67.25 in | Wt 252.0 lb

## 2017-09-22 DIAGNOSIS — N926 Irregular menstruation, unspecified: Secondary | ICD-10-CM

## 2017-09-22 DIAGNOSIS — Z01419 Encounter for gynecological examination (general) (routine) without abnormal findings: Secondary | ICD-10-CM | POA: Diagnosis not present

## 2017-09-22 NOTE — Progress Notes (Signed)
Subjective:     Patient ID: Theresa Trevino, female   DOB: [birth year], 32 y.o.   MRN: [id number]  Patient here for evaluation for period being late two weeks. Negative UPT and saw PCP for back pain and had serum HCG which was negative. Had some cramping prior to period and resolved once menses occurred. Period 5 days duration, heavy day 1 and normal appearance. 45 day cycle now. Last 3 cycles have been farther apart, but normal. Denies any pelvic pain or cramping today. Contraception none, pregnancy would be welcome. Recent thyroid US for enlargement was negative     Review of Systems  Constitutional: Negative for activity change and appetite change.       Objective:   Physical Exam  Constitutional: She appears well-developed and well-nourished.  Genitourinary: Vagina normal and uterus normal. There is no rash, tenderness, lesion or injury on the right labia. There is no rash, tenderness, lesion or injury on the left labia. Uterus is not enlarged and not tender. Cervix exhibits no motion tenderness, no discharge and no friability. Right adnexum displays no mass, no tenderness and no fullness. Left adnexum displays no mass, no tenderness and no fullness. No tenderness or bleeding in the vagina. No vaginal discharge found.  Lymphadenopathy:       Right: No inguinal adenopathy present.       Left: No inguinal adenopathy present.  Skin: Skin is warm and dry.  Psychiatric: She has a normal mood and affect. Her behavior is normal. Judgment and thought content normal.       Assessment:     Normal pelvic exam History cycle change from 28 to 45 days over there past 3-4 months Recent normal aex with TSH normal, and normal thyroid us due to enlargement Contraception none desired    Plan:     Discussed with patient normal pelvic exam. Discussed cycle changes can occur with weight change, stress, age and thyroid issues. Thyroid was normal and she has had weight change. No concerns with  negative UPT at home and serum HCG with her MD and had period. Continue to keep menses calendar and report if no period in 3 months. Also can cycle with other females in house or work with change. Questions addressed. Felt reassured and will call if questions.  Rv prn

## 2017-09-22 NOTE — Patient Instructions (Signed)
Need to call if no menses in 3 months or if concerns with symptoms or increase bleeding.

## 2017-10-01 ENCOUNTER — Ambulatory Visit
Admission: RE | Admit: 2017-10-01 | Discharge: 2017-10-01 | Disposition: A | Discharge status: Home / Self Care | Payer: PRIVATE HEALTH INSURANCE | Source: Ambulatory Visit | Attending: Internal Medicine | Admitting: Internal Medicine

## 2017-10-01 DIAGNOSIS — R200 Anesthesia of skin: Secondary | ICD-10-CM

## 2017-10-01 DIAGNOSIS — R202 Paresthesia of skin: Secondary | ICD-10-CM

## 2017-10-01 DIAGNOSIS — M5416 Radiculopathy, lumbar region: Secondary | ICD-10-CM

## 2017-10-02 ENCOUNTER — Encounter: Payer: Self-pay | Admitting: Internal Medicine

## 2017-10-05 ENCOUNTER — Encounter: Payer: Self-pay | Admitting: Internal Medicine

## 2017-10-05 ENCOUNTER — Ambulatory Visit (INDEPENDENT_AMBULATORY_CARE_PROVIDER_SITE_OTHER): Payer: PRIVATE HEALTH INSURANCE | Admitting: Neurology

## 2017-10-05 DIAGNOSIS — R200 Anesthesia of skin: Secondary | ICD-10-CM | POA: Diagnosis not present

## 2017-10-05 DIAGNOSIS — R202 Paresthesia of skin: Secondary | ICD-10-CM | POA: Diagnosis not present

## 2017-10-05 NOTE — Procedures (Signed)
[facility]  [address], [address]  [place], [place] [address] Tel: [phone] Fax:  [phone] Test Date:  10/05/2017  Patient: Rickard PatienceJechelle Curfman DOB: 1985/09/05 Physician: Nita Sickleonika Sirena Riddle, DO  Sex: Female Height: 5\' 7"  Ref Phys: Nita Sickleonika Jessee Mezera, DO  ID#: [id number] Temp: 33.6C Technician:    Patient Complaints: This is a 32 year old female referred for evaluation of low back pain with left leg tingling and pain.  NCV & EMG Findings: Extensive electrodiagnostic testing of the left lower extremity shows:  1. Left sural and superficial peroneal sensory responses are within normal limits. 2. Left peroneal and tibial motor responses are within normal limits. 3. Left tibial H reflex study is within normal limits. 4. There is no evidence of active or chronic motor axon loss changes affecting any of the tested muscles. Motor unit configuration and recruitment pattern is within normal limits.  Impression: This is a normal study of the left lower extremity. In particular, there is no evidence of a lumbosacral radiculopathy or sensorimotor polyneuropathy.   ___________________________ Nita Sickleonika Chong Wojdyla, DO    Nerve Conduction Studies Anti Sensory Summary Table   Site NR Peak (ms) Norm Peak (ms) P-T Amp (V) Norm P-T Amp  Left Sup Peroneal Anti Sensory (Ant Lat Mall)  33.6C  12 cm    2.7 <4.5 9.2 >5  Left Sural Anti Sensory (Lat Mall)  33.6C  Calf    3.0 <4.5 22.0 >5   Motor Summary Table   Site NR Onset (ms) Norm Onset (ms) O-P Amp (mV) Norm O-P Amp Site1 Site2 Delta-0 (ms) Dist (cm) Vel (m/s) Norm Vel (m/s)  Left Peroneal Motor (Ext Dig Brev)  33.6C  Ankle    3.5 <5.5 5.8 >3 B Fib Ankle 8.2 40.0 49 >40  B Fib    11.7  5.8  Poplt B Fib 1.0 9.0 90 >40  Poplt    12.7  5.5         Left Tibial Motor (Abd Humbarger Brev)  33.6C  Ankle    2.0 <6.0 8.1 >8 Knee Ankle 10.1 44.0 44 >40  Knee    12.1  6.9          H Reflex Studies   NR H-Lat (ms) Lat Norm (ms) L-R H-Lat (ms)  Left  Tibial (Gastroc)  33.6C     34.42 <35    EMG   Side Muscle Ins Act Fibs Psw Fasc Number Recrt Dur Dur. Amp Amp. Poly Poly. Comment  Left AntTibialis Nml Nml Nml Nml Nml Nml Nml Nml Nml Nml Nml Nml N/A  Left Gastroc Nml Nml Nml Nml Nml Nml Nml Nml Nml Nml Nml Nml N/A  Left RectFemoris Nml Nml Nml Nml Nml Nml Nml Nml Nml Nml Nml Nml N/A  Left GluteusMed Nml Nml Nml Nml Nml Nml Nml Nml Nml Nml Nml Nml N/A  Left BicepsFemS Nml Nml Nml Nml Nml Nml Nml Nml Nml Nml Nml Nml N/A  Left Lumbo Parasp Low Nml Nml Nml Nml NE - - - - - - - N/A      Waveforms:

## 2017-10-18 NOTE — Telephone Encounter (Signed)
Results were given to patient at her office visit with Leota Sauerseborah Leonard CNM on 09/22/2017. Encounter closed.

## 2019-04-24 IMAGING — US US THYROID
1 series · 14 of 25 positions shown · non-contrast
Comparison: None.

CLINICAL DATA: Other.  Thyroid nodule.

EXAM:
THYROID ULTRASOUND
TECHNIQUE: Ultrasound examination of the thyroid gland and adjacent soft
tissues was performed.

[Series 1: us thyroid · 0.04mm/px · 14 of 41 slices shown]
[im 1/41]
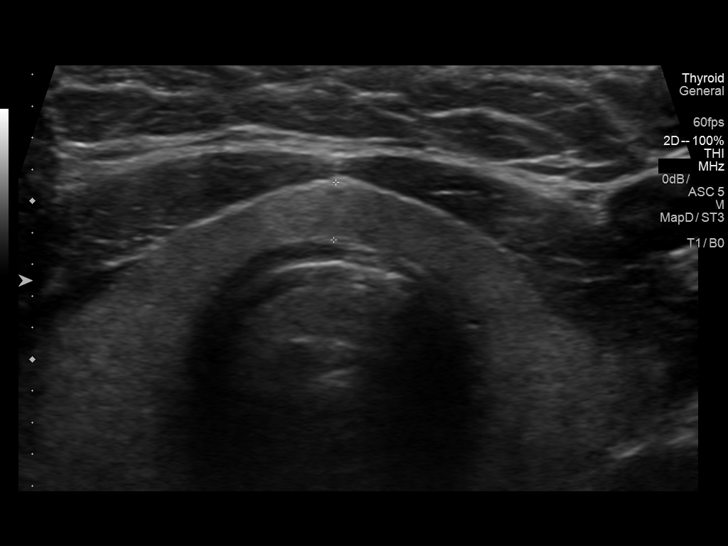
[im 4/41]
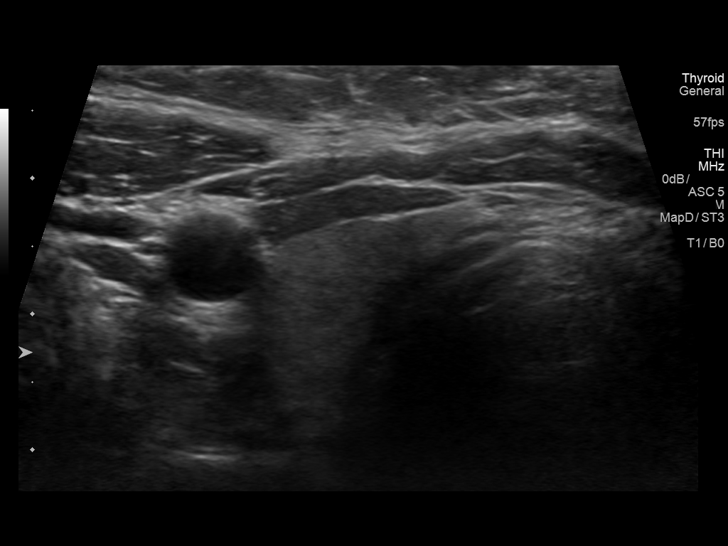
[im 7/41]
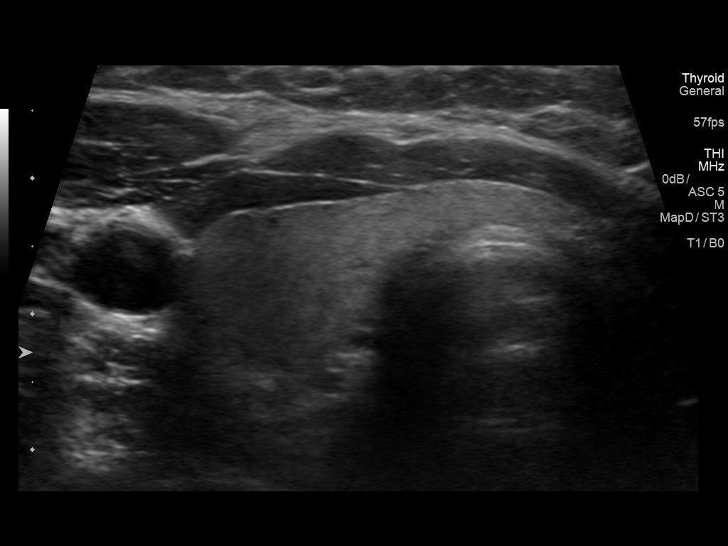
[im 11/41]
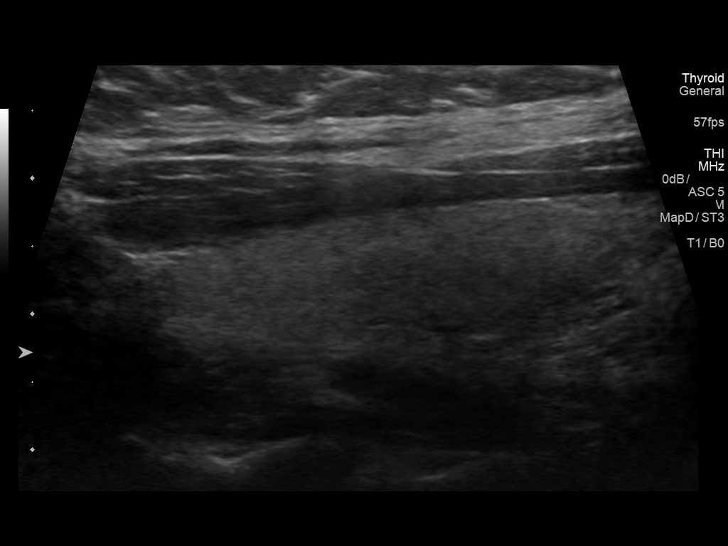
[im 14/41]
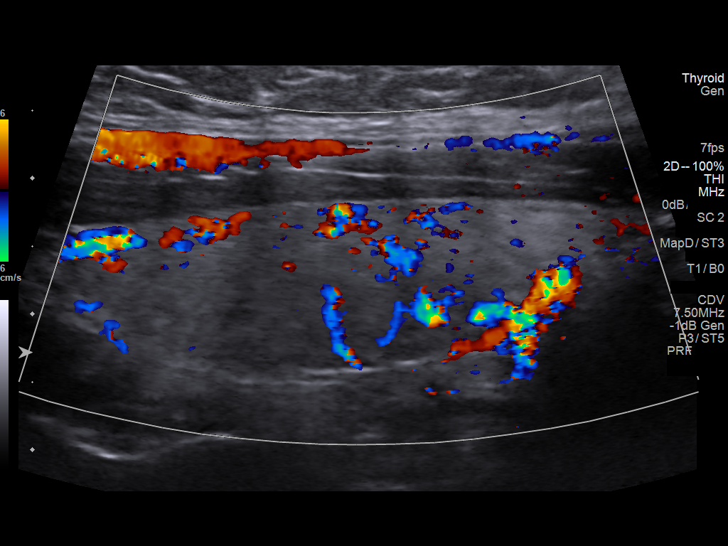
[im 16/41]
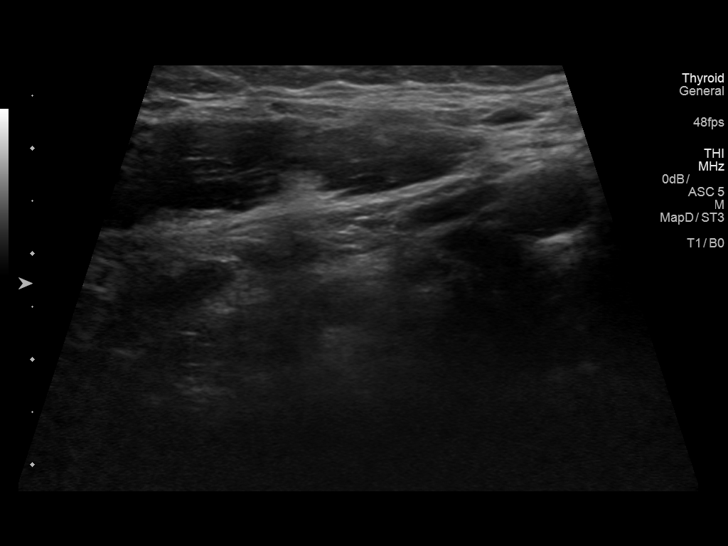
[im 19/41]
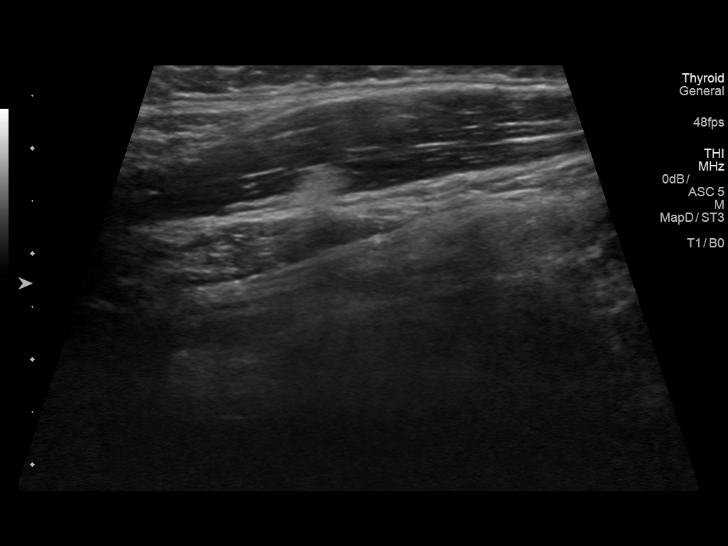
[im 22/41]
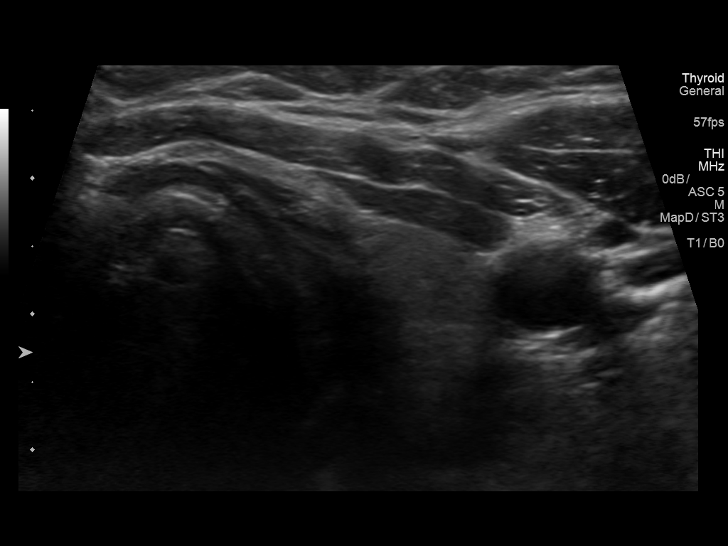
[im 26/41]
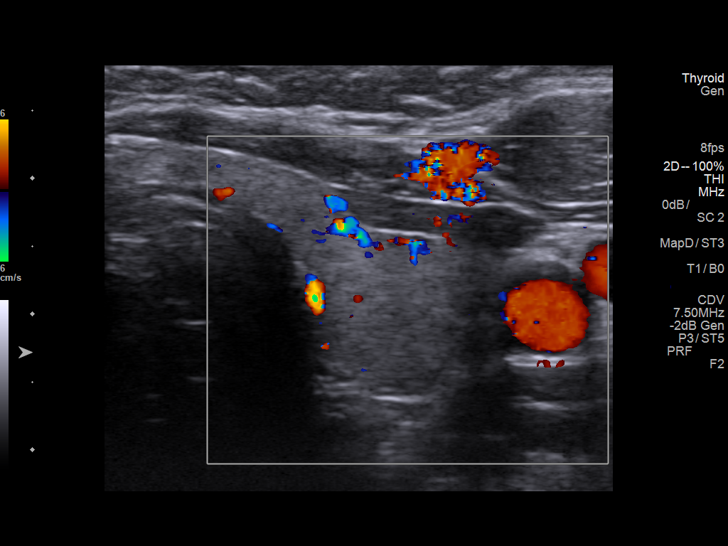
[im 27/41]
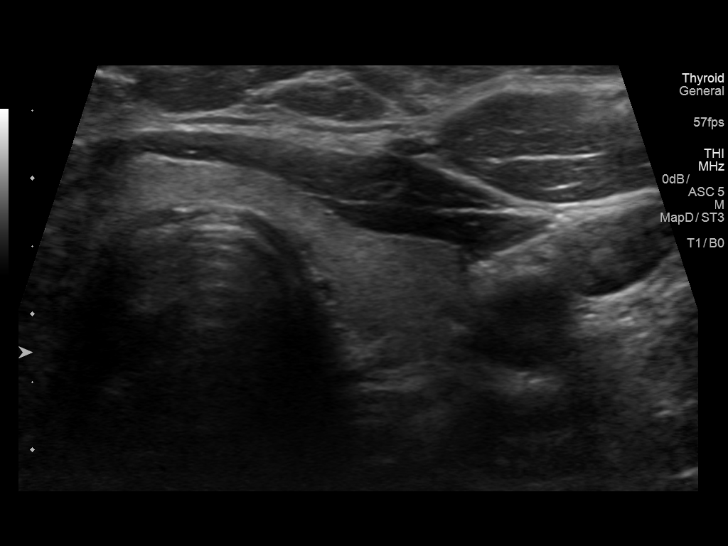
[im 31/41]
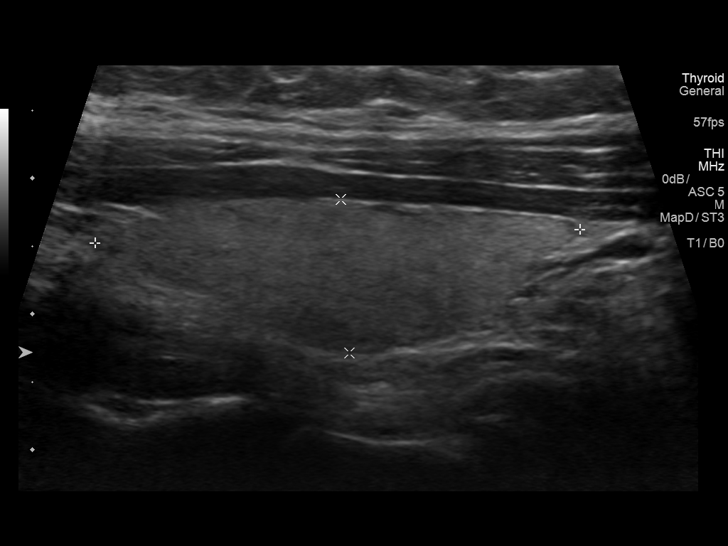
[im 34/41]
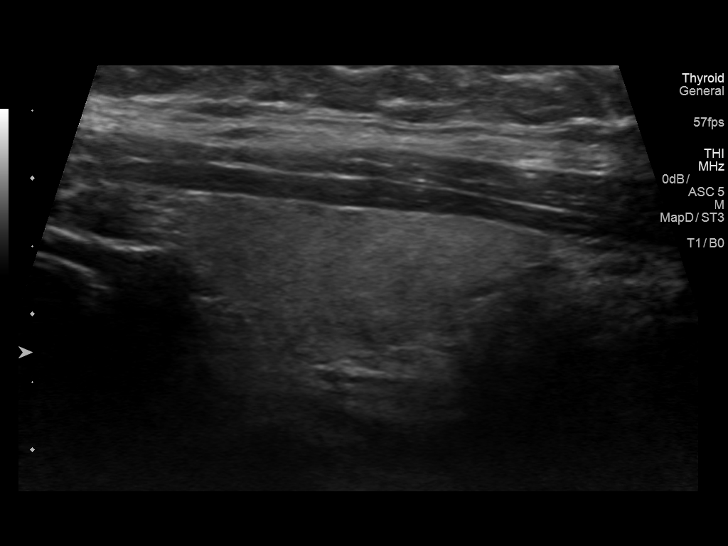
[im 37/41]
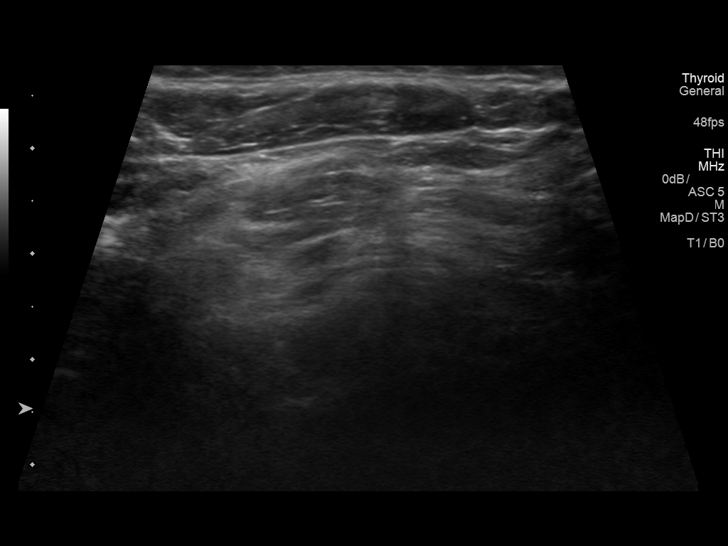
[im 41/41]
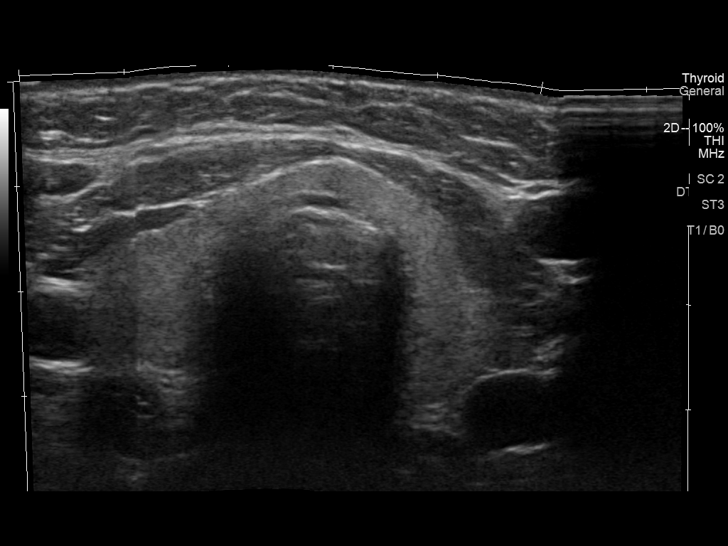

[14 of 25 positions shown; findings below may reference images not displayed]

FINDINGS: Parenchymal Echotexture: Mildly heterogenous

Isthmus: 0.4 cm

Right lobe: 4.2 x 1.2 x 1.5 cm

Left lobe: 3.6 x 1.1 x 1.5 cm

_________________________________________________________

Estimated total number of nodules >/= 1 cm: 0

Number of spongiform nodules >/=  2 cm not described below (TR1): 0

Number of mixed cystic and solid nodules >/= 1.5 cm not described
below (TR2): 0

_________________________________________________________

No discrete nodules are seen within the thyroid gland.
IMPRESSION: The gland is mildly heterogeneous without focal nodule.

The above is in keeping with the ACR TI-RADS recommendations - [HOSPITAL] 8666;[DATE].

## 2019-08-15 IMAGING — MR MR LUMBAR SPINE W/O CM
4 of 5 series · 26 of 48 positions shown · non-contrast
Comparison: None.

CLINICAL DATA: Low back pain radiating down the posterior leg and
left knee for 6 months

EXAM:
MRI LUMBAR SPINE WITHOUT CONTRAST
TECHNIQUE: Multiplanar, multisequence MR imaging of the lumbar spine was
performed. No intravenous contrast was administered.

[Series 4: T2 · sagittal · 4.0mm · 0.59mm/px · 6 of 15 slices shown (1 of 2)]
[im 1/15]
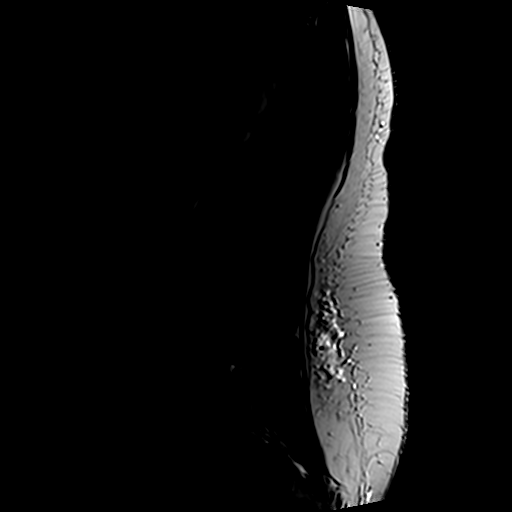
[im 3/15]
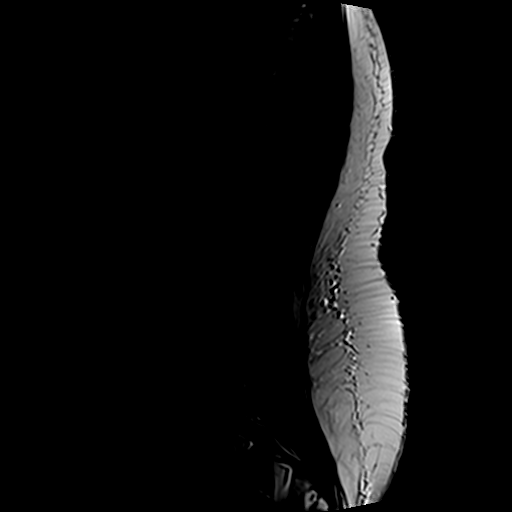
[im 6/15]
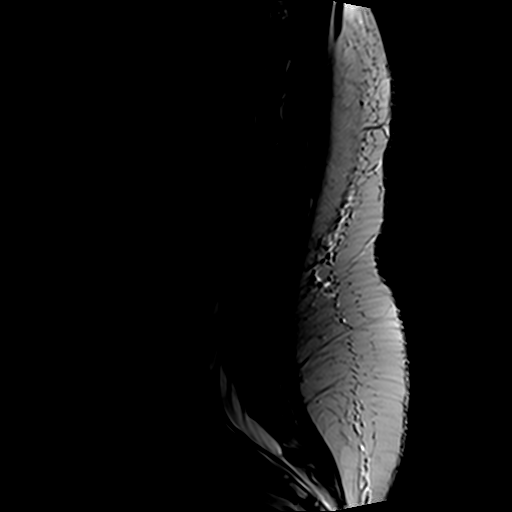
[im 9/15]
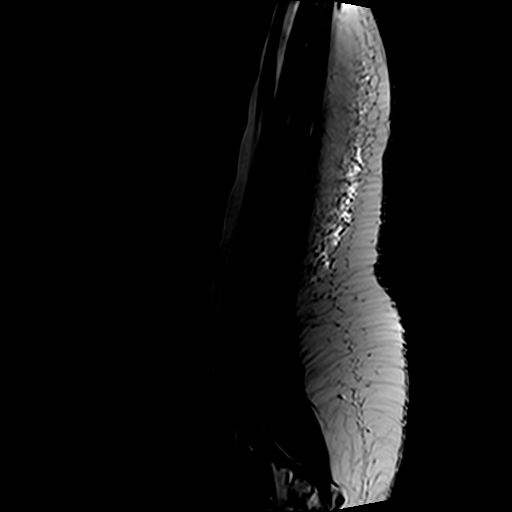
[im 12/15]
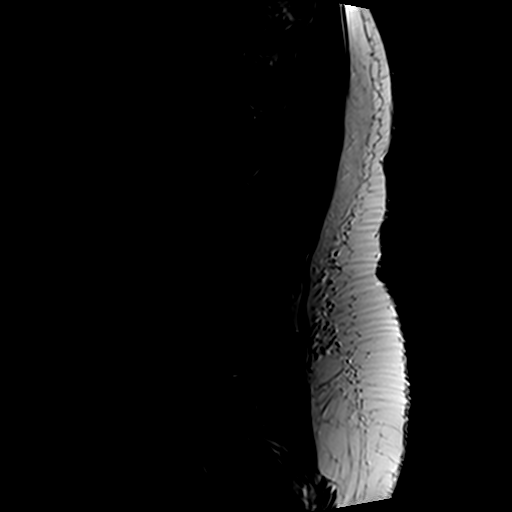
[im 15/15]
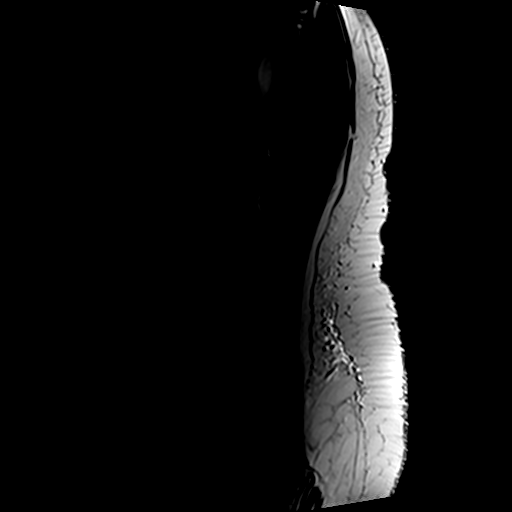

[Series 6: T2 · axial · 4.0mm · 0.70mm/px · z∈[-78,+176]mm · 10 of 49 slices shown (2 of 2)]
[im 4/49]
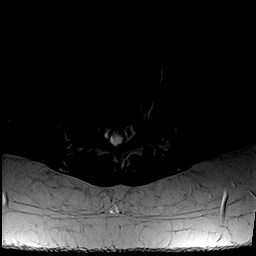
[im 7/49]
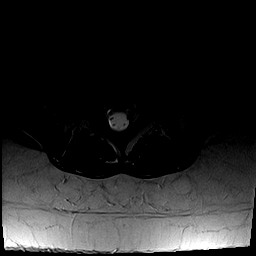
[im 10/49]
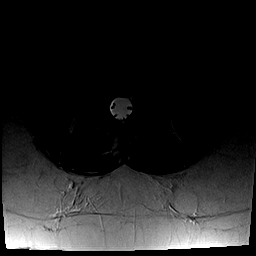
[im 17/49]
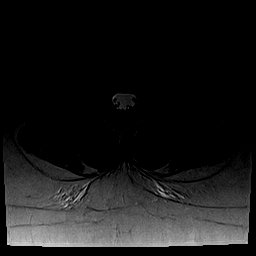
[im 23/49]
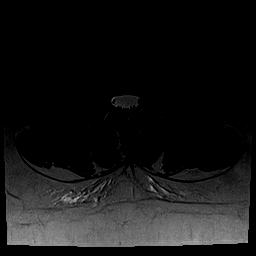
[im 26/49]
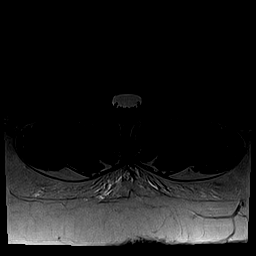
[im 29/49]
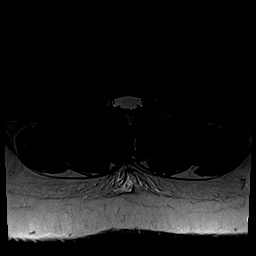
[im 36/49]
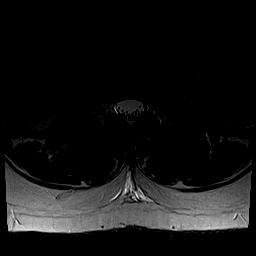
[im 42/49]
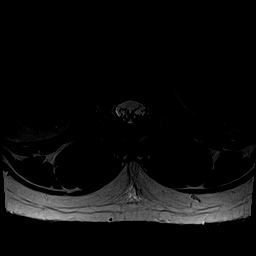
[im 49/49]
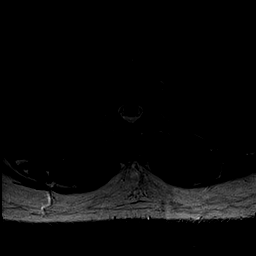

[Series 7: T1 · axial · 4.0mm · 0.35mm/px · z∈[-78,+135]mm · 7 of 49 slices shown (1 of 2)]
[im 4/49]
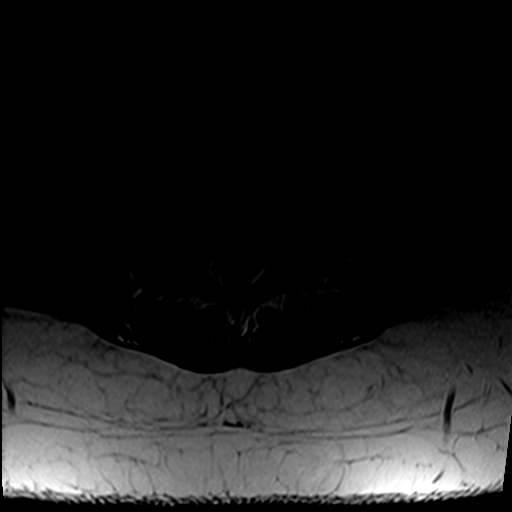
[im 7/49]
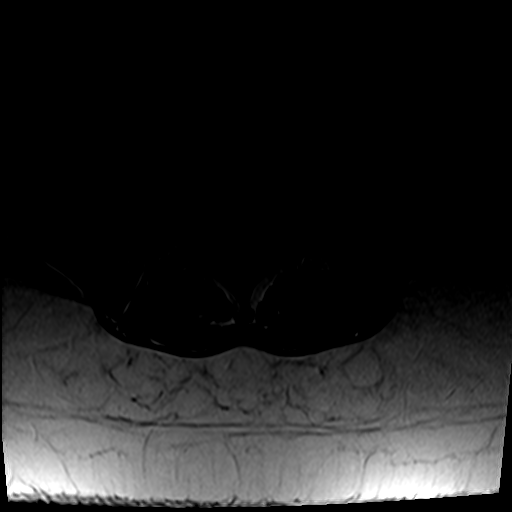
[im 10/49]
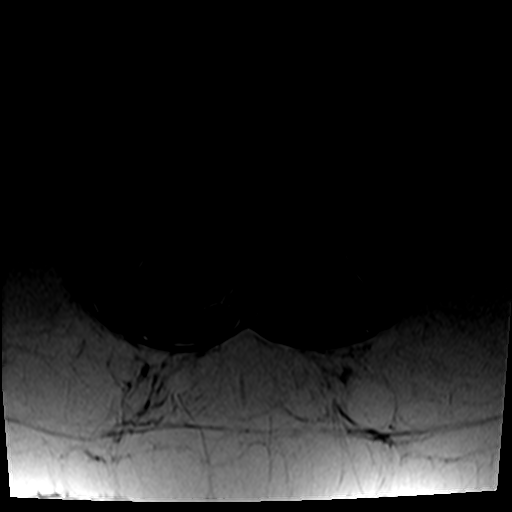
[im 17/49]
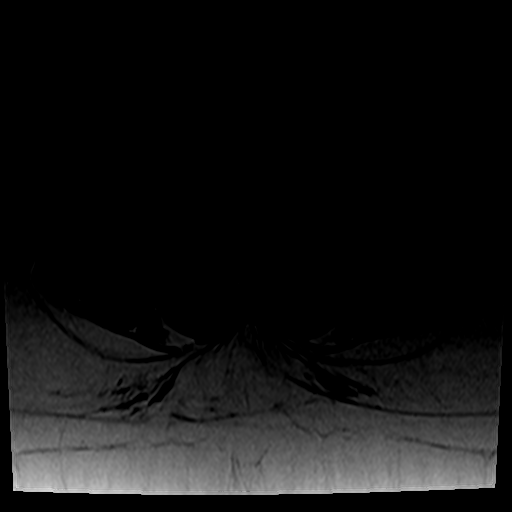
[im 23/49]
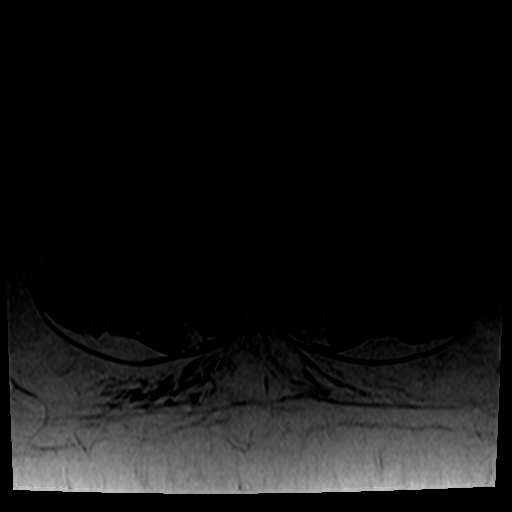
[im 26/49]
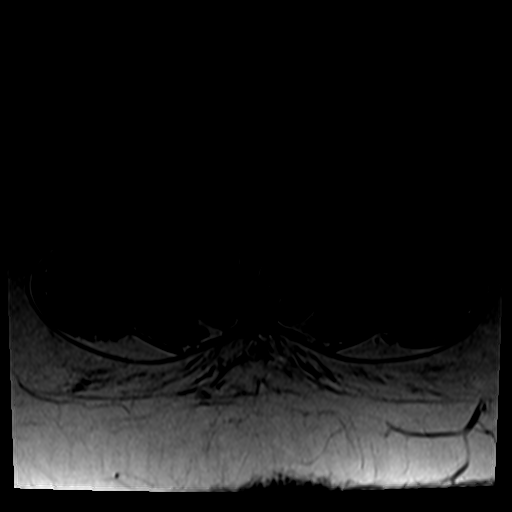
[im 42/49]
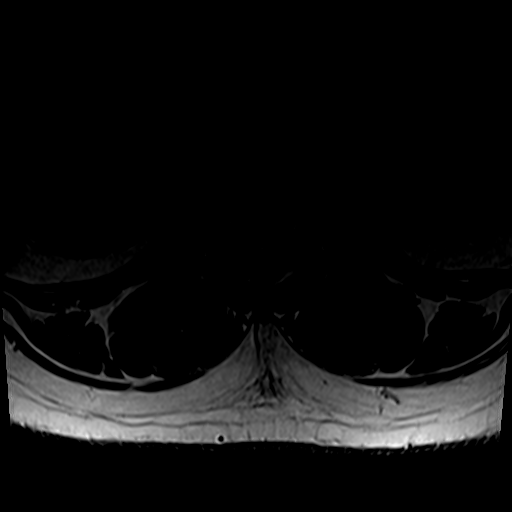

[Series 8: T1 · sagittal · 4.0mm · 0.59mm/px · 3 of 15 slices shown (2 of 2)]
[im 1/15]
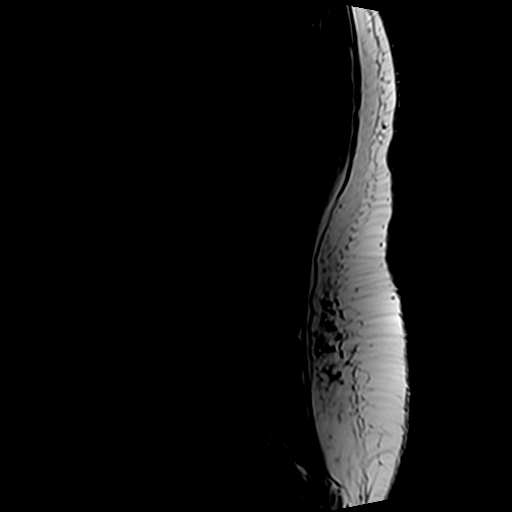
[im 8/15]
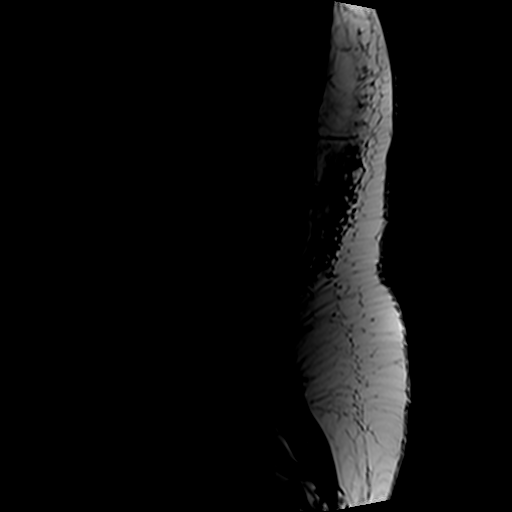
[im 15/15]
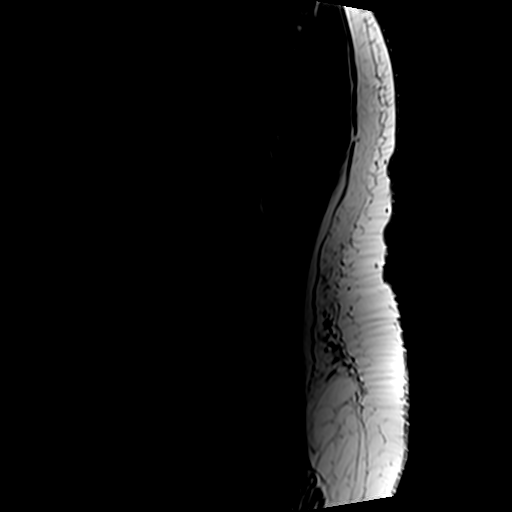

[26 of 48 positions shown; findings below may reference images not displayed]

FINDINGS: Segmentation:  Standard.

Alignment:  Physiologic.

Vertebrae:  No fracture, evidence of discitis, or bone lesion.

Conus medullaris and cauda equina: Conus extends to the T12 level.
Conus and cauda equina appear normal.

Paraspinal and other soft tissues: No focal paraspinal abnormality.

Disc levels:

Disc spaces: Disc spaces are maintained.

T12-L1: No significant disc bulge. No evidence of neural foraminal
stenosis. No central canal stenosis.

L1-L2: No significant disc bulge. No evidence of neural foraminal
stenosis. No central canal stenosis.

L2-L3: No significant disc bulge. No evidence of neural foraminal
stenosis. No central canal stenosis.

L3-L4: No significant disc bulge. No evidence of neural foraminal
stenosis. No central canal stenosis.

L4-L5: Mild broad-based disc bulge. Mild bilateral facet arthropathy
with a small 4 mm right facet extra-spinal synovial cyst. No
evidence of neural foraminal stenosis. No central canal stenosis.

L5-S1: Mild broad-based disc bulge. Mild bilateral facet
arthropathy. No evidence of neural foraminal stenosis. No central
canal stenosis.
IMPRESSION: 1. No significant lumbar spine disc protrusion, foraminal stenosis
or central canal stenosis.
2. Mild bilateral facet arthropathy at L4-5 and L5-S1.

## 2019-11-15 ENCOUNTER — Encounter: Payer: Self-pay | Admitting: Certified Nurse Midwife
